# Patient Record
Sex: Female | Born: 1975
Health system: Southern US, Community
[De-identification: ages and names within clinical notes are randomized; demographics above are authoritative.]

## PROBLEM LIST (undated history)

## (undated) DIAGNOSIS — N7011 Chronic salpingitis: Secondary | ICD-10-CM

## (undated) DIAGNOSIS — N736 Female pelvic peritoneal adhesions (postinfective): Secondary | ICD-10-CM

## (undated) DIAGNOSIS — K219 Gastro-esophageal reflux disease without esophagitis: Secondary | ICD-10-CM

## (undated) DIAGNOSIS — Z973 Presence of spectacles and contact lenses: Secondary | ICD-10-CM

## (undated) HISTORY — PX: WISDOM TOOTH EXTRACTION: SHX21

## (undated) HISTORY — DX: Gastro-esophageal reflux disease without esophagitis: K21.9

---

## 1999-02-27 ENCOUNTER — Emergency Department (HOSPITAL_COMMUNITY): Admission: EM | Admit: 1999-02-27 | Discharge: 1999-02-28 | Payer: Self-pay | Admitting: Emergency Medicine

## 1999-03-16 ENCOUNTER — Other Ambulatory Visit: Admission: RE | Admit: 1999-03-16 | Discharge: 1999-03-16 | Payer: Self-pay | Admitting: Obstetrics and Gynecology

## 2000-10-24 ENCOUNTER — Other Ambulatory Visit: Admission: RE | Admit: 2000-10-24 | Discharge: 2000-10-24 | Payer: Self-pay | Admitting: *Deleted

## 2000-10-24 ENCOUNTER — Other Ambulatory Visit: Admission: RE | Admit: 2000-10-24 | Discharge: 2000-10-24 | Payer: Self-pay | Admitting: Obstetrics and Gynecology

## 2001-07-15 ENCOUNTER — Other Ambulatory Visit: Admission: RE | Admit: 2001-07-15 | Discharge: 2001-07-15 | Payer: Self-pay | Admitting: Obstetrics and Gynecology

## 2003-02-27 ENCOUNTER — Other Ambulatory Visit: Admission: RE | Admit: 2003-02-27 | Discharge: 2003-02-27 | Payer: Self-pay | Admitting: Obstetrics and Gynecology

## 2003-02-27 ENCOUNTER — Other Ambulatory Visit: Admission: RE | Admit: 2003-02-27 | Discharge: 2003-02-27 | Payer: Self-pay | Admitting: *Deleted

## 2004-04-22 ENCOUNTER — Other Ambulatory Visit: Admission: RE | Admit: 2004-04-22 | Discharge: 2004-04-22 | Payer: Self-pay | Admitting: Obstetrics and Gynecology

## 2007-12-11 LAB — CONVERTED CEMR LAB: Pap Smear: NORMAL

## 2009-02-19 ENCOUNTER — Ambulatory Visit: Payer: Self-pay | Admitting: Family Medicine

## 2009-02-20 ENCOUNTER — Encounter: Payer: Self-pay | Admitting: Family Medicine

## 2009-02-25 LAB — CONVERTED CEMR LAB
ALT: 18 units/L (ref 0–35)
AST: 22 units/L (ref 0–37)
Albumin: 4.2 g/dL (ref 3.5–5.2)
Alkaline Phosphatase: 58 units/L (ref 39–117)
BUN: 8 mg/dL (ref 6–23)
Basophils Absolute: 0 10*3/uL (ref 0.0–0.1)
Basophils Relative: 0.1 % (ref 0.0–3.0)
Bilirubin, Direct: 0.1 mg/dL (ref 0.0–0.3)
CO2: 29 meq/L (ref 19–32)
Calcium: 9.1 mg/dL (ref 8.4–10.5)
Chloride: 105 meq/L (ref 96–112)
Cholesterol: 169 mg/dL (ref 0–200)
Creatinine, Ser: 0.8 mg/dL (ref 0.4–1.2)
Eosinophils Absolute: 0.1 10*3/uL (ref 0.0–0.7)
Eosinophils Relative: 1.3 % (ref 0.0–5.0)
GFR calc non Af Amer: 88.84 mL/min (ref >60)
Glucose, Bld: 92 mg/dL (ref 70–99)
HCT: 40.7 % (ref 36.0–46.0)
HDL: 79.5 mg/dL (ref >39.00)
Hemoglobin: 13.6 g/dL (ref 12.0–15.0)
LDL Cholesterol: 83 mg/dL (ref 0–99)
Lymphocytes Relative: 33.2 % (ref 12.0–46.0)
Lymphs Abs: 1.3 10*3/uL (ref 0.7–4.0)
MCHC: 33.4 g/dL (ref 30.0–36.0)
MCV: 93.4 fL (ref 78.0–100.0)
Monocytes Absolute: 0.3 10*3/uL (ref 0.1–1.0)
Monocytes Relative: 8.7 % (ref 3.0–12.0)
Neutro Abs: 2.2 10*3/uL (ref 1.4–7.7)
Neutrophils Relative %: 56.7 % (ref 43.0–77.0)
Platelets: 188 10*3/uL (ref 150.0–400.0)
Potassium: 4.8 meq/L (ref 3.5–5.1)
RBC: 4.36 M/uL (ref 3.87–5.11)
RDW: 11.9 % (ref 11.5–14.6)
Sodium: 140 meq/L (ref 135–145)
TSH: 1.16 microintl units/mL (ref 0.35–5.50)
Total Bilirubin: 0.6 mg/dL (ref 0.3–1.2)
Total CHOL/HDL Ratio: 2
Total Protein: 6.9 g/dL (ref 6.0–8.3)
Triglycerides: 34 mg/dL (ref 0.0–149.0)
VLDL: 6.8 mg/dL (ref 0.0–40.0)
WBC: 3.9 10*3/uL — ABNORMAL LOW (ref 4.5–10.5)

## 2009-06-10 LAB — CONVERTED CEMR LAB: Pap Smear: NORMAL

## 2009-06-10 LAB — HM PAP SMEAR

## 2009-08-26 ENCOUNTER — Ambulatory Visit: Payer: Self-pay | Admitting: Family Medicine

## 2009-08-26 DIAGNOSIS — D179 Benign lipomatous neoplasm, unspecified: Secondary | ICD-10-CM | POA: Insufficient documentation

## 2009-08-26 DIAGNOSIS — R87619 Unspecified abnormal cytological findings in specimens from cervix uteri: Secondary | ICD-10-CM | POA: Insufficient documentation

## 2010-02-22 NOTE — Assessment & Plan Note (Signed)
Summary: NEW PT TO EST/CPX/CLE   Vital Signs:  Patient profile:   35 year old female Height:      65 inches Weight:      151.4 pounds BMI:     25.29 Temp:     99.0 degrees F oral Pulse rate:   84 / minute Pulse rhythm:   regular BP sitting:   110 / 80  (left arm) Cuff size:   regular  Vitals Entered By: Benny Lennert CMA Duncan Dull) (August 26, 2009 10:19 AM)  History of Present Illness: Chief complaint New Patient  Working on [employer].  She is working on getting all points to [employer].    Allergies (verified): No Known Drug Allergies  Past History:  Past Medical History: Allergies, well controlled Urinary Tract Infections GERD, resolved  Past Surgical History: Denies surgical history Colposcopy: negative 2004  Family History: Uncle--CABG at 34yo  Father: prostate cancer, HTN, DM, MI age 35, transient global amnesia mother: healthy siblings: smokers, overweight  Family History of Arthritis Family History Diabetes 1st degree relative Family History Hypertension Family History of CAD Female 1st degree relative 33yo--F Family History of Melanoma--  father  Social History: Married--  no children Former Smoker...4 pack year history Alcohol use-yes, wine daily, 1 glass Drug use-no Regular exercise-yes, walking daily Occupation:  Engineer, site for a group  Physical Exam  General:  Well-developed,well-nourished,in no acute distress; alert,appropriate and cooperative throughout examination Eyes:  No corneal or conjunctival inflammation noted. EOMI. Perrla. Funduscopic exam benign, without hemorrhages, exudates or papilledema. Vision grossly normal. Ears:  External ear exam shows no significant lesions or deformities.  Otoscopic examination reveals clear canals, tympanic membranes are intact bilaterally without bulging, retraction, inflammation or discharge. Hearing is grossly normal bilaterally. Nose:  External nasal examination  shows no deformity or inflammation. Nasal mucosa are pink and moist without lesions or exudates. Mouth:  Oral mucosa and oropharynx without lesions or exudates.  Teeth in good repair. Neck:  No deformities, masses, or tenderness noted. Lungs:  Normal respiratory effort, chest expands symmetrically. Lungs are clear to auscultation, no crackles or wheezes. Heart:  Normal rate and regular rhythm. S1 and S2 normal without gallop, murmur, click, rub or other extra sounds. Abdomen:  Bowel sounds positive,abdomen soft and non-tender without masses, organomegaly or hernias noted. Msk:  No deformity or scoliosis noted of thoracic or lumbar spine.   Pulses:  R and L posterior tibial pulses are full and equal bilaterally  Extremities:  0.3 small mobile nodule on right leg Neurologic:  No cranial nerve deficits noted. Station and gait are normal. Plantar reflexes are down-going bilaterally. DTRs are symmetrical throughout. Sensory, motor and coordinative functions appear intact.   Impression & Recommendations:  Problem # 1:  LIPOMA (ICD-214.9) Pt reassured likely begnign lesion. Call if tender  or increasing in size.   Problem # 2:  Sports physical (other med exam for admin purpose) (ICD-V70.3) Assessment: Comment Only Forms completed for [employer] for insurance.   Prior Medications (reviewed today): None Current Allergies (reviewed today): No known allergies   Last PAP:  normal (12/11/2007 8:55:14 AM) PAP Result Date:  06/10/2009 PAP Result:  normal PAP Next Due:  1 yr

## 2010-02-22 NOTE — Assessment & Plan Note (Signed)
Summary: CPX/NEW/PH   Vital Signs:  Patient profile:   35 year old female Height:      65 inches Weight:      151.13 pounds BMI:     25.24 Temp:     98.2 degrees F oral Pulse rate:   83 / minute Pulse rhythm:   regular BP sitting:   110 / 70  (left arm) Cuff size:   regular  Vitals Entered By: Army Fossa CMA (February 19, 2009 8:24 AM) CC: To establish, CPX- no complaints- pt is fasting. no pap.   History of Present Illness: Pt here for cpe and labs.   Pt sees Dr Huel Cote for gyn.   No complaints.   Preventive Screening-Counseling & Management  Alcohol-Tobacco     Alcohol drinks/day: 2     Alcohol type: wine     Smoking Status: quit     Smoke Cessation Stage: quit     Packs/Day: 0.25     Year Started: 2002     Year Quit: 2006  Caffeine-Diet-Exercise     Caffeine use/day: 2     Caffeine Counseling: not indicated; caffeine use is not excessive or problematic     Does Patient Exercise: yes     Type of exercise: long distance run, 5 K, bike     Exercise (avg: min/session): 30-60     Times/week: 3  Hep-HIV-STD-Contraception     Dental Visit-last 6 months no     Dental Care Counseling: 1x a year     SBE monthly: no      Sexual History:  currently monogamous.        Drug Use:  no.    Current Medications (verified): 1)  None  Allergies (verified): No Known Drug Allergies  Past History:  Family History: Last updated: 02/19/2009 PUncle--CABG at 33yo  Family History of Arthritis Family History Diabetes 1st degree relative Family History Hypertension Family History of CAD Female 1st degree relative 33yo--F Family History of Melanoma--  father  Social History: Last updated: 02/19/2009 Married--  no children Former Smoker Alcohol use-yes Drug use-no Regular exercise-yes Occupation:  Engineer, site for a group  Risk Factors: Alcohol Use: 2 (02/19/2009) Caffeine Use: 2 (02/19/2009) Exercise: yes (02/19/2009)  Risk Factors: Smoking  Status: quit (02/19/2009) Packs/Day: 0.25 (02/19/2009)  Past Medical History: Allergies Urinary Tract Infections GERD  Past Surgical History: Denies surgical history  Family History: Reviewed history and no changes required. PUncle--CABG at 33yo  Family History of Arthritis Family History Diabetes 1st degree relative Family History Hypertension Family History of CAD Female 1st degree relative 33yo--F Family History of Melanoma--  father  Social History: Married--  no children Former Smoker Alcohol use-yes Drug use-no Regular exercise-yes Occupation:  Engineer, site for a group Smoking Status:  quit Drug Use:  no Does Patient Exercise:  yes Packs/Day:  0.25 Caffeine use/day:  2 Occupation:  employed Deal[employer] Care w/in 6 mos.:  no Sexual History:  currently monogamous  Review of Systems      See HPI General:  Denies chills, fatigue, fever, loss of appetite, malaise, sleep disorder, sweats, weakness, and weight loss. Eyes:  Denies blurring, discharge, double vision, eye irritation, eye pain, halos, itching, light sensitivity, red eye, vision loss-1 eye, and vision loss-both eyes; optho q1y . ENT:  Denies decreased hearing, difficulty swallowing, ear discharge, earache, hoarseness, nasal congestion, nosebleeds, postnasal drainage, ringing in ears, sinus pressure, and sore throat. CV:  Denies bluish discoloration of lips or nails, chest pain or discomfort, difficulty  breathing at night, difficulty breathing while lying down, fainting, fatigue, leg cramps with exertion, lightheadness, near fainting, palpitations, shortness of breath with exertion, swelling of feet, swelling of hands, and weight gain. Resp:  Denies chest discomfort, chest pain with inspiration, cough, coughing up blood, excessive snoring, hypersomnolence, morning headaches, pleuritic, shortness of breath, sputum productive, and wheezing. GI:  Denies abdominal pain, bloody stools, change in bowel habits,  constipation, dark tarry stools, diarrhea, excessive appetite, gas, hemorrhoids, indigestion, loss of appetite, nausea, vomiting, vomiting blood, and yellowish skin color. GU:  Denies abnormal vaginal bleeding, decreased libido, discharge, dysuria, hematuria, incontinence, nocturia, urinary frequency, and urinary hesitancy. MS:  Denies joint pain, joint redness, joint swelling, loss of strength, low back pain, mid back pain, muscle aches, muscle , cramps, muscle weakness, stiffness, and thoracic pain. Derm:  Denies changes in color of skin, changes in nail beds, dryness, excessive perspiration, flushing, hair loss, insect bite(s), itching, lesion(s), poor wound healing, and rash. Neuro:  Denies brief paralysis, difficulty with concentration, disturbances in coordination, falling down, headaches, inability to speak, memory loss, numbness, poor balance, seizures, sensation of room spinning, tingling, tremors, visual disturbances, and weakness. Psych:  Denies alternate hallucination ( auditory/visual), anxiety, depression, easily angered, easily tearful, irritability, mental problems, panic attacks, sense of great danger, suicidal thoughts/plans, thoughts of violence, unusual visions or sounds, and thoughts /plans of harming others. Endo:  Denies cold intolerance, excessive hunger, excessive thirst, excessive urination, heat intolerance, polyuria, and weight change. Heme:  Denies abnormal bruising, bleeding, enlarge lymph nodes, fevers, pallor, and skin discoloration.  Physical Exam  General:  Well-developed,well-nourished,in no acute distress; alert,appropriate and cooperative throughout examination Head:  Normocephalic and atraumatic without obvious abnormalities. No apparent alopecia or balding. Eyes:  vision grossly intact, pupils equal, pupils round, pupils reactive to light, and no injection.   Ears:  External ear exam shows no significant lesions or deformities.  Otoscopic examination reveals clear  canals, tympanic membranes are intact bilaterally without bulging, retraction, inflammation or discharge. Hearing is grossly normal bilaterally. Nose:  External nasal examination shows no deformity or inflammation. Nasal mucosa are pink and moist without lesions or exudates. Mouth:  Oral mucosa and oropharynx without lesions or exudates.  Teeth in good repair. Neck:  No deformities, masses, or tenderness noted. Chest Wall:  No deformities, masses, or tenderness noted. Breasts:  No mass, nodules, thickening, tenderness, bulging, retraction, inflamation, nipple discharge or skin changes noted.   Lungs:  Normal respiratory effort, chest expands symmetrically. Lungs are clear to auscultation, no crackles or wheezes. Heart:  normal rate and no murmur.   Abdomen:  Bowel sounds positive,abdomen soft and non-tender without masses, organomegaly or hernias noted. Rectal:  gyn Genitalia:  gyn Msk:  normal ROM, no joint tenderness, no joint swelling, no joint warmth, no redness over joints, no joint deformities, no joint instability, and no crepitation.   Pulses:  R and L carotid,radial,femoral,dorsalis pedis and posterior tibial pulses are full and equal bilaterally Extremities:  No clubbing, cyanosis, edema, or deformity noted with normal full range of motion of all joints.   Neurologic:  No cranial nerve deficits noted. Station and gait are normal. Plantar reflexes are down-going bilaterally. DTRs are symmetrical throughout. Sensory, motor and coordinative functions appear intact. Skin:  Intact without suspicious lesions or rashes Cervical Nodes:  No lymphadenopathy noted Axillary Nodes:  No palpable lymphadenopathy Psych:  Oriented X3, normally interactive, good eye contact, not anxious appearing, not depressed appearing, and not agitated.     Impression & Recommendations:  Problem # 1:  PREVENTIVE HEALTH CARE (ICD-V70.0) ghm utd  SBE monthly Orders: Venipuncture (16109) TLB-Lipid Panel  (80061-LIPID) TLB-BMP (Basic Metabolic Panel-BMET) (80048-METABOL) TLB-CBC Platelet - w/Differential (85025-CBCD) TLB-Hepatic/Liver Function Pnl (80076-HEPATIC) TLB-TSH (Thyroid Stimulating Hormone) (84443-TSH)  Problem # 2:  FAMILY HISTORY OF MELANOMA (ICD-V16.8) skin check done today pt has a dermatologist--Dr Gruber---I advised she see her 1x a year  Flu Vaccine Next Due:  Refused TD Result Date:  09/04/2007 TD Result:  given TD Next Due:  10 yr PAP Result Date:  12/11/2007 PAP Result:  normal PAP Next Due:  1 yr  Appended Document: CPX/NEW/PH  Laboratory Results   Urine Tests   Date/Time Reported: February 19, 2009 10:34 AM   Routine Urinalysis   Color: lt. yellow Appearance: Clear Glucose: negative   (Normal Range: Negative) Bilirubin: negative   (Normal Range: Negative) Ketone: negative   (Normal Range: Negative) Spec. Gravity: <1.005   (Normal Range: 1.003-1.035) Blood: moderate   (Normal Range: Negative) pH: 7.5   (Normal Range: 5.0-8.0) Protein: negative   (Normal Range: Negative) Urobilinogen: negative   (Normal Range: 0-1) Nitrite: negative   (Normal Range: Negative) Leukocyte Esterace: negative   (Normal Range: Negative)    Comments: Floydene Flock  February 19, 2009 10:35 AM cx sent

## 2010-07-06 ENCOUNTER — Encounter: Payer: Self-pay | Admitting: Family Medicine

## 2010-07-07 ENCOUNTER — Telehealth: Payer: Self-pay | Admitting: *Deleted

## 2010-07-07 ENCOUNTER — Ambulatory Visit (INDEPENDENT_AMBULATORY_CARE_PROVIDER_SITE_OTHER): Payer: 59 | Admitting: Family Medicine

## 2010-07-07 ENCOUNTER — Encounter: Payer: Self-pay | Admitting: Family Medicine

## 2010-07-07 VITALS — BP 108/78 | HR 66 | Temp 98.3°F | Ht 66.0 in | Wt 147.8 lb

## 2010-07-07 DIAGNOSIS — H6980 Other specified disorders of Eustachian tube, unspecified ear: Secondary | ICD-10-CM

## 2010-07-07 DIAGNOSIS — H6990 Unspecified Eustachian tube disorder, unspecified ear: Secondary | ICD-10-CM

## 2010-07-07 MED ORDER — FLUTICASONE PROPIONATE 50 MCG/ACT NA SUSP: 2.0000 | Freq: Every day | NASAL | Status: DC

## 2010-07-07 NOTE — Telephone Encounter (Signed)
Entered in error. Wrong patient  

## 2010-07-07 NOTE — Telephone Encounter (Signed)
Patient says that she has been swimming a lot and now has swimmers ear. She says that she has trouble with this every summer. She is asking if she can get some ear drops called in for it. Please advise.

## 2010-07-07 NOTE — Patient Instructions (Addendum)
Phyenylephrine -- as decongestant. (ALKA SELZER PLUS SINUS HAS THIS)  Add in a nasal spray, 2 sprays each nostril once a day

## 2010-07-07 NOTE — Progress Notes (Signed)
Teresa Brock, a 35 y.o. female presents today in the office for the following:   Last sat did a 5k [place].   Did not take her allergy medication then, and on Monday, eyes were really bloodshot, and feeling some congestion and  Taking some Allegra twelve hours.  Used some qttips.  Fullness in ears, allergies  ROS: GEN: No acute illnesses, no fevers, chills. GI: No n/v/d, eating normally Pulm: No SOB Interactive and getting along well at home.  Otherwise, ROS is as per the HPI.   Physical Exam  Blood pressure 108/78, pulse 66, temperature 98.3 F (36.8 C), temperature source Oral, height 5\' 6"  (1.676 m), weight 147 lb 12.8 oz (67.042 kg), SpO2 98.00%.  GEN: WDWN, NAD, Non-toxic, A & O x 3 HEENT: Atraumatic, Normocephalic. Neck supple. No masses, No LAD. Ears and Nose: No external deformity. Tragus NT, serous fluid B, no sinus T EXTR: No c/c/e NEURO Normal gait.  PSYCH: Normally interactive. Conversant. Not depressed or anxious appearing.  Calm demeanor.

## 2011-02-08 ENCOUNTER — Encounter: Payer: Self-pay | Admitting: Family Medicine

## 2011-02-08 ENCOUNTER — Ambulatory Visit (INDEPENDENT_AMBULATORY_CARE_PROVIDER_SITE_OTHER): Payer: 59 | Admitting: Family Medicine

## 2011-02-08 VITALS — BP 100/60 | HR 60 | Temp 98.9°F | Ht 66.0 in | Wt 153.4 lb

## 2011-02-08 DIAGNOSIS — G2589 Other specified extrapyramidal and movement disorders: Secondary | ICD-10-CM

## 2011-02-08 DIAGNOSIS — R279 Unspecified lack of coordination: Secondary | ICD-10-CM

## 2011-02-08 DIAGNOSIS — M62838 Other muscle spasm: Secondary | ICD-10-CM

## 2011-02-08 NOTE — Progress Notes (Signed)
  Patient Name: Teresa Brock Date of Birth: 03/04/1975 Age: 36 y.o. Medical Record Number: [id number] Gender: female Date of Encounter: 02/08/2011  History of Present Illness:  Teresa Brock is a 36 y.o. very pleasant female patient who presents with the following:  Last few months some back pain --- doing some 5k's. This winter has stopped some exercise. Mostly on the right side and on the right side. Will wake up with turning and moving in the moddile of the night Gen. Of pain around the lower scapular border. No radiculopathy. No numbness or tingling. No neck pathology noted. Mostly parascapular stabilization issue. She is having some pain when she is at work and working on the computer.  Past Medical History, Surgical History, Social History, Family History, Problem List, Medications, and Allergies have been reviewed and updated if relevant.  Review of Systems:  GEN: No fevers, chills. Nontoxic. Primarily MSK c/o today. MSK: Detailed in the HPI GI: tolerating PO intake without difficulty Neuro: No numbness, parasthesias, or tingling associated. Otherwise the pertinent positives of the ROS are noted above.    Physical Examination: Filed Vitals:   02/08/11 1132  BP: 100/60  Pulse: 60  Temp: 98.9 F (37.2 C)  TempSrc: Oral  Height: 5\' 6"  (1.676 m)  Weight: 153 lb 6.4 oz (69.582 kg)  SpO2: 100%    Body mass index is 24.76 kg/(m^2).   GEN: WDWN, NAD, Non-toxic, Alert & Oriented x 3 HEENT: Atraumatic, Normocephalic.  Ears and Nose: No external deformity. EXTR: No clubbing/cyanosis/edema NEURO: Normal gait.  PSYCH: Normally interactive. Conversant. Not depressed or anxious appearing.  Calm demeanor.   Shoulder: b Inspection: mild-mod winging, with notable scapular dyskinesis, RIGHT greater than LEFT. Noted posteriorly in abduction with motion. There is also a generalized weakness in this area. Ecchymosis/edema: neg  AC joint, scapula, clavicle:  NT Cervical spine: NT, full ROM Spurling's: neg Abduction: full, 5/5 Flexion: full, 5/5 IR, full, lift-off: 5/5 ER at neutral: full, 5/5 AC crossover and compression: neg Neer: neg Hawkins: neg Drop Test: neg Empty Can: neg Supraspinatus insertion: NT Bicipital groove: NT Speed's: neg Yergason's: neg Sulcus sign: neg C5-T1 intact Sensation intact Grip 5/5   Assessment and Plan: 1. Scapular dyskinesis   2. Trapezius muscle spasm     Primarily due to the rhomboid and trapezius spasm, deconditioning of the scapular stabilizers. Reviewed scapular stabilization

## 2011-02-08 NOTE — Patient Instructions (Signed)
[email or web address] ALL SHOULDER VIDEOS AND WORKOUT DO 3-4 TIMES A WEEK

## 2011-05-17 ENCOUNTER — Ambulatory Visit (INDEPENDENT_AMBULATORY_CARE_PROVIDER_SITE_OTHER): Payer: 59 | Admitting: Family Medicine

## 2011-05-17 ENCOUNTER — Encounter: Payer: Self-pay | Admitting: Family Medicine

## 2011-05-17 VITALS — BP 120/78 | HR 64 | Temp 98.7°F | Ht 66.0 in | Wt 153.0 lb

## 2011-05-17 DIAGNOSIS — M940 Chondrocostal junction syndrome [Tietze]: Secondary | ICD-10-CM

## 2011-05-17 NOTE — Progress Notes (Signed)
  Patient Name: Teresa Brock Date of Birth: [birth year] Age: 36 y.o. Medical Record Number: [id number] Gender: female Date of Encounter: 05/17/2011  History of Present Illness:  Teresa Brock is a 36 y.o. very pleasant female patient who presents with the following:  Jogs a fair amount.   About a year ago, noticed when eating dinner, and felt something in her side. And massaged, no pain, then about a month later it happened again. Now when noticing, and will feel some pain in her posture. Felt that acutely with laughing really hard. She had been concerned, because it kept on lasting for some time. She wanted to make sure that I didn't think it was a type of cancer or potential heart problem or anything else serious.   Dad had melanoma and prostate cancer.   Past Medical History, Surgical History, Social History, Family History, Problem List, Medications, and Allergies have been reviewed and updated if relevant.  Review of Systems:  GEN: No acute illnesses, no fevers, chills. GI: No n/v/d, eating normally Pulm: No SOB Interactive and getting along well at home.  Otherwise, ROS is as per the HPI.   Physical Examination: Filed Vitals:   05/17/11 0907  BP: 120/78  Pulse: 64  Temp: 98.7 F (37.1 C)  TempSrc: Oral  Height: 5\' 6"  (1.676 m)  Weight: 153 lb (69.4 kg)  SpO2: 98%    Body mass index is 24.69 kg/(m^2).   GEN: WDWN, NAD, Non-toxic, Alert & Oriented x 3 HEENT: Atraumatic, Normocephalic.  Ears and Nose: No external deformity. Chest wall: no tenderness along rib insertions at the sternum. Perl test is negative. Along the RIGHT 10th rib space, there is tenderness to palpation between the ribs. This is focal, and the patient describes as the patient's pain location EXTR: No clubbing/cyanosis/edema NEURO: Normal gait.  PSYCH: Normally interactive. Conversant. Not depressed or anxious appearing.  Calm demeanor.    Assessment and Plan: 1. Costochondritis      I reassured the patient. This is muscular skeletal chest pain. If it flares up and bothers her, recommended that she take some Motrin or Aleve.

## 2013-05-02 ENCOUNTER — Telehealth: Payer: Self-pay | Admitting: Family Medicine

## 2013-05-02 NOTE — Telephone Encounter (Signed)
Yes, work her in at any 32min OV.

## 2013-05-02 NOTE — Telephone Encounter (Signed)
Pt called requesting cpe in April. Would you be able to accommodate?

## 2013-05-06 ENCOUNTER — Encounter: Payer: Self-pay | Admitting: Family Medicine

## 2013-05-06 ENCOUNTER — Ambulatory Visit (INDEPENDENT_AMBULATORY_CARE_PROVIDER_SITE_OTHER): Payer: 59 | Admitting: Family Medicine

## 2013-05-06 VITALS — BP 120/76 | HR 63 | Temp 98.3°F | Ht 67.0 in | Wt 157.8 lb

## 2013-05-06 DIAGNOSIS — Z0000 Encounter for general adult medical examination without abnormal findings: Secondary | ICD-10-CM

## 2013-05-06 DIAGNOSIS — Z13220 Encounter for screening for lipoid disorders: Secondary | ICD-10-CM

## 2013-05-06 LAB — COMPREHENSIVE METABOLIC PANEL
ALT: 13 U/L (ref 0–35)
AST: 16 U/L (ref 0–37)
Albumin: 3.9 g/dL (ref 3.5–5.2)
Alkaline Phosphatase: 45 U/L (ref 39–117)
BUN: 9 mg/dL (ref 6–23)
CO2: 25 mEq/L (ref 19–32)
Calcium: 8.9 mg/dL (ref 8.4–10.5)
Chloride: 109 mEq/L (ref 96–112)
Creatinine, Ser: 0.8 mg/dL (ref 0.4–1.2)
GFR: 87.85 mL/min (ref >60.00)
Glucose, Bld: 92 mg/dL (ref 70–99)
Potassium: 4.2 mEq/L (ref 3.5–5.1)
Sodium: 139 mEq/L (ref 135–145)
Total Bilirubin: 0.1 mg/dL — ABNORMAL LOW (ref 0.3–1.2)
Total Protein: 6.7 g/dL (ref 6.0–8.3)

## 2013-05-06 LAB — LIPID PANEL
Cholesterol: 171 mg/dL (ref 0–200)
HDL: 74 mg/dL (ref >39.00)
LDL Cholesterol: 89 mg/dL (ref 0–99)
Total CHOL/HDL Ratio: 2
Triglycerides: 40 mg/dL (ref 0.0–149.0)
VLDL: 8 mg/dL (ref 0.0–40.0)

## 2013-05-06 NOTE — Progress Notes (Signed)
Pre visit review using our clinic review tool, if applicable. No additional management support is needed unless otherwise documented below in the visit note. 

## 2013-05-06 NOTE — Progress Notes (Signed)
   Subjective:    Patient ID: Teresa Brock, female    DOB: Feb 15, 1975, 38 y.o.   MRN: [id number]  HPI The patient is here for annual wellness exam and preventative care.    Was normal at work last year. She is due for annual labs.  She has GYN.Marland Kitchen She has scheduled pap and breast exam in 05/2012.   No specific issues. She is working on having a baby.  exercise 3-4 times a week, walking/running. She runs for fun. Diet: healthy, water, good calcium intake.  akes daily multivitamin.   Review of Systems  Constitutional: Negative for chills and fatigue.  HENT: Negative for ear pain.   Eyes: Negative for pain.  Respiratory: Negative for cough, shortness of breath and wheezing.   Cardiovascular: Negative for chest pain, palpitations and leg swelling.  Gastrointestinal: Negative for abdominal pain, diarrhea, constipation and blood in stool.  Genitourinary: Negative for dysuria, urgency and frequency.  Musculoskeletal: Negative for myalgias.  Neurological: Negative for dizziness and numbness.  Psychiatric/Behavioral: Negative for dysphoric mood. The patient is not nervous/anxious.        Objective:   Physical Exam  Constitutional: Vital signs are normal. She appears well-developed and well-nourished. She is cooperative.  Non-toxic appearance. She does not appear ill. No distress.  HENT:  Head: Normocephalic.  Right Ear: Hearing, tympanic membrane, external ear and ear canal normal.  Left Ear: Hearing, tympanic membrane, external ear and ear canal normal.  Nose: Nose normal.  Eyes: Conjunctivae, EOM and lids are normal. Pupils are equal, round, and reactive to light. Lids are everted and swept, no foreign bodies found.  Neck: Trachea normal and normal range of motion. Neck supple. Carotid bruit is not present. No mass and no thyromegaly present.  Cardiovascular: Normal rate, regular rhythm, S1 normal, S2 normal, normal heart sounds and intact distal pulses.  Exam reveals no  gallop.   No murmur heard. Pulmonary/Chest: Effort normal and breath sounds normal. No respiratory distress. She has no wheezes. She has no rhonchi. She has no rales.  Abdominal: Soft. Normal appearance and bowel sounds are normal. She exhibits no distension, no fluid wave, no abdominal bruit and no mass. There is no hepatosplenomegaly. There is no tenderness. There is no rebound, no guarding and no CVA tenderness. No hernia.  Lymphadenopathy:    She has no cervical adenopathy.    She has no axillary adenopathy.  Neurological: She is alert. She has normal strength. No cranial nerve deficit or sensory deficit.  Skin: Skin is warm, dry and intact. No rash noted.  Psychiatric: Her speech is normal and behavior is normal. Judgment normal. Her mood appears not anxious. Cognition and memory are normal. She does not exhibit a depressed mood.          Assessment & Plan:  The patient's preventative maintenance and recommended screening tests for an annual wellness exam were reviewed in full today. Brought up to date unless services declined.  Counselled on the importance of diet, exercise, and its role in overall health and mortality. The patient's FH and SH was reviewed, including their home life, tobacco status, and drug and alcohol status.   Vacccines up to date:  Will look into whether she has Tdap or td.

## 2013-05-06 NOTE — Patient Instructions (Signed)
Keep up the great work with healthy eating and exercise!

## 2013-12-01 ENCOUNTER — Encounter (HOSPITAL_BASED_OUTPATIENT_CLINIC_OR_DEPARTMENT_OTHER): Payer: Self-pay | Admitting: *Deleted

## 2013-12-01 NOTE — Progress Notes (Signed)
NPO AFTER MN WITH EXCEPTION CLEAR LIQUIDS UNTIL 0700 (NO CREAM/ MILK PRODUCTS).  ARRIVE AT 1145. NEEDS HG AND URINE PREG. 

## 2013-12-03 ENCOUNTER — Encounter (HOSPITAL_BASED_OUTPATIENT_CLINIC_OR_DEPARTMENT_OTHER)
Admission: RE | Disposition: A | Discharge status: Home / Self Care | Payer: Self-pay | Source: Ambulatory Visit | Attending: Obstetrics and Gynecology

## 2013-12-03 ENCOUNTER — Ambulatory Visit (HOSPITAL_BASED_OUTPATIENT_CLINIC_OR_DEPARTMENT_OTHER)
Admission: RE | Admit: 2013-12-03 | Discharge: 2013-12-03 | Disposition: A | Discharge status: Home / Self Care | Payer: 59 | Source: Ambulatory Visit | Attending: Obstetrics and Gynecology | Admitting: Obstetrics and Gynecology

## 2013-12-03 ENCOUNTER — Encounter (HOSPITAL_BASED_OUTPATIENT_CLINIC_OR_DEPARTMENT_OTHER): Payer: Self-pay

## 2013-12-03 ENCOUNTER — Ambulatory Visit (HOSPITAL_BASED_OUTPATIENT_CLINIC_OR_DEPARTMENT_OTHER): Payer: 59 | Admitting: Certified Registered"

## 2013-12-03 DIAGNOSIS — N9489 Other specified conditions associated with female genital organs and menstrual cycle: Secondary | ICD-10-CM | POA: Insufficient documentation

## 2013-12-03 DIAGNOSIS — N7011 Chronic salpingitis: Secondary | ICD-10-CM | POA: Diagnosis present

## 2013-12-03 DIAGNOSIS — Z87891 Personal history of nicotine dependence: Secondary | ICD-10-CM | POA: Insufficient documentation

## 2013-12-03 DIAGNOSIS — N736 Female pelvic peritoneal adhesions (postinfective): Secondary | ICD-10-CM | POA: Diagnosis not present

## 2013-12-03 DIAGNOSIS — K219 Gastro-esophageal reflux disease without esophagitis: Secondary | ICD-10-CM | POA: Insufficient documentation

## 2013-12-03 HISTORY — DX: Female pelvic peritoneal adhesions (postinfective): N73.6

## 2013-12-03 HISTORY — DX: Chronic salpingitis: N70.11

## 2013-12-03 HISTORY — PX: LAPAROSCOPY: SHX197

## 2013-12-03 HISTORY — DX: Presence of spectacles and contact lenses: Z97.3

## 2013-12-03 LAB — POCT HEMOGLOBIN-HEMACUE: HEMOGLOBIN: 13.2 g/dL (ref 12.0–15.0)

## 2013-12-03 LAB — POCT PREGNANCY, URINE: Preg Test, Ur: NEGATIVE

## 2013-12-03 SURGERY — LAPAROSCOPY OPERATIVE
Anesthesia: General | Site: Uterus | Laterality: Bilateral

## 2013-12-03 MED ORDER — BUPIVACAINE-EPINEPHRINE 0.25% -1:200000 IJ SOLN
INTRAMUSCULAR | Status: DC | PRN
  Administered 2013-12-03: 8 mL

## 2013-12-03 MED ORDER — ONDANSETRON HCL 4 MG PO TABS: 4.0000 mg | ORAL_TABLET | Freq: Three times a day (TID) | ORAL | Status: DC | PRN

## 2013-12-03 MED ORDER — MIDAZOLAM HCL 5 MG/5ML IJ SOLN
INTRAMUSCULAR | Status: DC | PRN
  Administered 2013-12-03: 2 mg via INTRAVENOUS

## 2013-12-03 MED ORDER — DEXAMETHASONE SODIUM PHOSPHATE 4 MG/ML IJ SOLN
INTRAMUSCULAR | Status: DC | PRN
  Administered 2013-12-03: 8 mg via INTRAVENOUS

## 2013-12-03 MED ORDER — SUCCINYLCHOLINE CHLORIDE 20 MG/ML IJ SOLN
INTRAMUSCULAR | Status: DC | PRN
  Administered 2013-12-03: 100 mg via INTRAVENOUS

## 2013-12-03 MED ORDER — FENTANYL CITRATE 0.05 MG/ML IJ SOLN
INTRAMUSCULAR | Status: AC
  Filled 2013-12-03: qty 2

## 2013-12-03 MED ORDER — OXYCODONE HCL 5 MG PO TABS
7.5000 mg | ORAL_TABLET | Freq: Once | ORAL | Status: AC
Start: 2013-12-03 — End: 2013-12-03
  Administered 2013-12-03: 7.5 mg via ORAL
  Filled 2013-12-03: qty 2

## 2013-12-03 MED ORDER — GLYCOPYRROLATE 0.2 MG/ML IJ SOLN
INTRAMUSCULAR | Status: DC | PRN
  Administered 2013-12-03: 0.4 mg via INTRAVENOUS

## 2013-12-03 MED ORDER — LACTATED RINGERS IV SOLN
INTRAVENOUS | Status: DC
  Administered 2013-12-03 (×3): via INTRAVENOUS
  Filled 2013-12-03: qty 1000

## 2013-12-03 MED ORDER — PROMETHAZINE HCL 25 MG/ML IJ SOLN
6.2500 mg | INTRAMUSCULAR | Status: DC | PRN
  Filled 2013-12-03: qty 1

## 2013-12-03 MED ORDER — LACTATED RINGERS IR SOLN
Status: DC | PRN
  Administered 2013-12-03: 3000 mL

## 2013-12-03 MED ORDER — KETOROLAC TROMETHAMINE 30 MG/ML IJ SOLN
15.0000 mg | Freq: Once | INTRAMUSCULAR | Status: DC | PRN
  Filled 2013-12-03: qty 1

## 2013-12-03 MED ORDER — MIDAZOLAM HCL 2 MG/2ML IJ SOLN
INTRAMUSCULAR | Status: AC
  Filled 2013-12-03: qty 2

## 2013-12-03 MED ORDER — ACETAMINOPHEN 10 MG/ML IV SOLN
INTRAVENOUS | Status: DC | PRN
Start: 2013-12-03 — End: 2013-12-03
  Administered 2013-12-03: 1000 mg via INTRAVENOUS

## 2013-12-03 MED ORDER — ONDANSETRON HCL 4 MG/2ML IJ SOLN
INTRAMUSCULAR | Status: DC | PRN
  Administered 2013-12-03: 4 mg via INTRAVENOUS

## 2013-12-03 MED ORDER — METHYLENE BLUE 1 % INJ SOLN
INTRAMUSCULAR | Status: DC | PRN
  Administered 2013-12-03: 10 mL via SUBMUCOSAL

## 2013-12-03 MED ORDER — CEFAZOLIN SODIUM-DEXTROSE 2-3 GM-% IV SOLR
2.0000 g | INTRAVENOUS | Status: AC
  Administered 2013-12-03: 2 g via INTRAVENOUS
  Filled 2013-12-03: qty 50

## 2013-12-03 MED ORDER — FENTANYL CITRATE 0.05 MG/ML IJ SOLN
INTRAMUSCULAR | Status: AC
  Filled 2013-12-03: qty 6

## 2013-12-03 MED ORDER — NEOSTIGMINE METHYLSULFATE 10 MG/10ML IV SOLN
INTRAVENOUS | Status: DC | PRN
  Administered 2013-12-03: 3 mg via INTRAVENOUS

## 2013-12-03 MED ORDER — FENTANYL CITRATE 0.05 MG/ML IJ SOLN
INTRAMUSCULAR | Status: DC | PRN
  Administered 2013-12-03: 25 ug via INTRAVENOUS
  Administered 2013-12-03 (×2): 50 ug via INTRAVENOUS
  Administered 2013-12-03: 25 ug via INTRAVENOUS
  Administered 2013-12-03: 50 ug via INTRAVENOUS
  Administered 2013-12-03: 25 ug via INTRAVENOUS
  Administered 2013-12-03: 50 ug via INTRAVENOUS
  Administered 2013-12-03: 25 ug via INTRAVENOUS

## 2013-12-03 MED ORDER — ROCURONIUM BROMIDE 100 MG/10ML IV SOLN
INTRAVENOUS | Status: DC | PRN
  Administered 2013-12-03: 10 mg via INTRAVENOUS
  Administered 2013-12-03: 15 mg via INTRAVENOUS
  Administered 2013-12-03: 30 mg via INTRAVENOUS
  Administered 2013-12-03: 10 mg via INTRAVENOUS

## 2013-12-03 MED ORDER — FENTANYL CITRATE 0.05 MG/ML IJ SOLN
25.0000 ug | INTRAMUSCULAR | Status: DC | PRN
  Administered 2013-12-03 (×3): 50 ug via INTRAVENOUS
  Filled 2013-12-03: qty 1

## 2013-12-03 MED ORDER — KETOROLAC TROMETHAMINE 30 MG/ML IJ SOLN
INTRAMUSCULAR | Status: DC | PRN
  Administered 2013-12-03: 30 mg via INTRAVENOUS

## 2013-12-03 MED ORDER — LIDOCAINE HCL (CARDIAC) 20 MG/ML IV SOLN
INTRAVENOUS | Status: DC | PRN
  Administered 2013-12-03: 50 mg via INTRAVENOUS

## 2013-12-03 MED ORDER — OXYCODONE HCL 5 MG PO TABS
ORAL_TABLET | ORAL | Status: AC
  Filled 2013-12-03: qty 2

## 2013-12-03 MED ORDER — PROPOFOL 10 MG/ML IV BOLUS
INTRAVENOUS | Status: DC | PRN
  Administered 2013-12-03: 200 mg via INTRAVENOUS

## 2013-12-03 MED ORDER — SODIUM CHLORIDE 0.9 % IV SOLN
INTRAVENOUS | Status: DC | PRN
  Administered 2013-12-03: 10 mL via INTRAMUSCULAR

## 2013-12-03 MED ORDER — OXYCODONE-ACETAMINOPHEN 7.5-325 MG PO TABS: 1.0000 | ORAL_TABLET | ORAL | Status: DC | PRN

## 2013-12-03 MED ORDER — CEFAZOLIN SODIUM-DEXTROSE 2-3 GM-% IV SOLR
INTRAVENOUS | Status: AC
  Filled 2013-12-03: qty 50

## 2013-12-03 SURGICAL SUPPLY — 76 items
ADH SKN CLS APL DERMABOND .7 (GAUZE/BANDAGES/DRESSINGS)
BAG SPEC RTRVL LRG 6X4 10 (ENDOMECHANICALS)
BAG URINE DRAINAGE (UROLOGICAL SUPPLIES) ×2 IMPLANT
BARRIER ADHS 3X4 INTERCEED (GAUZE/BANDAGES/DRESSINGS) ×1 IMPLANT
BLADE 11 SAFETY STRL DISP (BLADE) ×1 IMPLANT
BLADE SURG 15 STRL LF DISP TIS (BLADE) ×1 IMPLANT
BLADE SURG 15 STRL SS (BLADE)
BRR ADH 4X3 ABS CNTRL BYND (GAUZE/BANDAGES/DRESSINGS) ×1
BRR ADH 6X5 SEPRAFILM 1 SHT (MISCELLANEOUS)
CATH FOLEY 2WAY SLVR  5CC 14FR (CATHETERS) ×1
CATH FOLEY 2WAY SLVR 5CC 14FR (CATHETERS) IMPLANT
CATH ROBINSON RED A/P 16FR (CATHETERS) IMPLANT
COVER MAYO STAND STRL (DRAPES) ×2 IMPLANT
DERMABOND ADVANCED (GAUZE/BANDAGES/DRESSINGS)
DERMABOND ADVANCED .7 DNX12 (GAUZE/BANDAGES/DRESSINGS) IMPLANT
DEVICE TROCAR PUNCTURE CLOSURE (ENDOMECHANICALS) IMPLANT
DRAPE CAMERA CLOSED 9X96 (DRAPES) ×2 IMPLANT
DRAPE UNDERBUTTOCKS STRL (DRAPE) ×2 IMPLANT
DRSG OPSITE POSTOP 3X4 (GAUZE/BANDAGES/DRESSINGS) IMPLANT
ELECT NDL TIP 2.8 STRL (NEEDLE) IMPLANT
ELECT NEEDLE TIP 2.8 STRL (NEEDLE) IMPLANT
ELECT REM PT RETURN 9FT ADLT (ELECTROSURGICAL) ×2
ELECTRODE REM PT RTRN 9FT ADLT (ELECTROSURGICAL) ×1 IMPLANT
EVACUATOR SMOKE 8.L (FILTER) IMPLANT
GLOVE BIO SURGEON STRL SZ 6.5 (GLOVE) ×2 IMPLANT
GLOVE BIO SURGEON STRL SZ8 (GLOVE) ×3 IMPLANT
GLOVE BIOGEL PI IND STRL 6.5 (GLOVE) IMPLANT
GLOVE BIOGEL PI IND STRL 8.5 (GLOVE) ×1 IMPLANT
GLOVE BIOGEL PI INDICATOR 6.5 (GLOVE) ×3
GLOVE BIOGEL PI INDICATOR 8.5 (GLOVE) ×1
GOWN STRL REUS W/ TWL LRG LVL3 (GOWN DISPOSABLE) IMPLANT
GOWN STRL REUS W/ TWL XL LVL3 (GOWN DISPOSABLE) IMPLANT
GOWN STRL REUS W/TWL LRG LVL3 (GOWN DISPOSABLE) ×4
GOWN STRL REUS W/TWL XL LVL3 (GOWN DISPOSABLE) ×2
HOLDER FOLEY CATH W/STRAP (MISCELLANEOUS) ×1 IMPLANT
MANIPULATOR UTERINE 4.5 ZUMI (MISCELLANEOUS) ×2 IMPLANT
NDL HYPO 25X1 1.5 SAFETY (NEEDLE) ×1 IMPLANT
NDL INSUFFLATION 14GA 120MM (NEEDLE) ×1 IMPLANT
NDL SPNL 22GX7 QUINCKE BK (NEEDLE) IMPLANT
NEEDLE HYPO 25X1 1.5 SAFETY (NEEDLE) ×2 IMPLANT
NEEDLE INSUFFLATION 14GA 120MM (NEEDLE) ×2 IMPLANT
NEEDLE SPNL 22GX7 QUINCKE BK (NEEDLE) ×2 IMPLANT
NS IRRIG 500ML POUR BTL (IV SOLUTION) ×2 IMPLANT
PACK BASIN DAY SURGERY FS (CUSTOM PROCEDURE TRAY) ×2 IMPLANT
PACK LAPAROSCOPY II (CUSTOM PROCEDURE TRAY) ×2 IMPLANT
PAD OB MATERNITY 4.3X12.25 (PERSONAL CARE ITEMS) ×2 IMPLANT
PENCIL BUTTON HOLSTER BLD 10FT (ELECTRODE) IMPLANT
POUCH SPECIMEN RETRIEVAL 10MM (ENDOMECHANICALS) IMPLANT
SCALPEL HARMONIC ACE (MISCELLANEOUS) ×1 IMPLANT
SEALER TISSUE G2 CVD JAW 35 (ENDOMECHANICALS) IMPLANT
SEALER TISSUE G2 CVD JAW 45CM (ENDOMECHANICALS) IMPLANT
SEPRAFILM MEMBRANE 5X6 (MISCELLANEOUS) IMPLANT
SET IRRIG TUBING LAPAROSCOPIC (IRRIGATION / IRRIGATOR) ×1 IMPLANT
SOLUTION ANTI FOG 6CC (MISCELLANEOUS) ×2 IMPLANT
SUT MNCRL AB 4-0 PS2 18 (SUTURE) ×2 IMPLANT
SUT PROLENE 0 CT 1 30 (SUTURE) IMPLANT
SUT PROLENE 5 0 P 3 (SUTURE) ×2 IMPLANT
SUT VIC AB 2-0 CT1 27 (SUTURE)
SUT VIC AB 2-0 CT1 TAPERPNT 27 (SUTURE) IMPLANT
SUT VIC AB 2-0 UR6 27 (SUTURE) IMPLANT
SUT VICRYL 0 TIES 12 18 (SUTURE) IMPLANT
SYR 20CC LL (SYRINGE) IMPLANT
SYR 3ML 23GX1 SAFETY (SYRINGE) IMPLANT
SYR 50ML LL SCALE MARK (SYRINGE) IMPLANT
SYR 5ML LL (SYRINGE) IMPLANT
SYR CONTROL 10ML LL (SYRINGE) ×2 IMPLANT
SYRINGE 12CC LL (MISCELLANEOUS) ×2 IMPLANT
SYS LAPSCP GELPORT 120MM (MISCELLANEOUS)
SYSTEM LAPSCP GELPORT 120MM (MISCELLANEOUS) IMPLANT
TOWEL OR 17X24 6PK STRL BLUE (TOWEL DISPOSABLE) ×4 IMPLANT
TRAY DSU PREP LF (CUSTOM PROCEDURE TRAY) ×2 IMPLANT
TROCAR OPTI TIP 5M 100M (ENDOMECHANICALS) ×4 IMPLANT
TROCAR XCEL DIL TIP R 11M (ENDOMECHANICALS) IMPLANT
TUBING INSUFFLATION 10FT LAP (TUBING) ×2 IMPLANT
WARMER LAPAROSCOPE (MISCELLANEOUS) ×2 IMPLANT
WATER STERILE IRR 500ML POUR (IV SOLUTION) ×1 IMPLANT

## 2013-12-03 NOTE — Transfer of Care (Signed)
Immediate Anesthesia Transfer of Care Note  Patient: Teresa Brock  Procedure(s) Performed: Procedure(s) (LRB): LAPAROSCOPY OPERATIVE,LYSIS OF ADHESION, LEFT SALPINGECTOMY, RIGHT SALPINGONEOSTOMY (Bilateral)  Patient Location: PACU  Anesthesia Type: General  Level of Consciousness: awake, oriented, sedated and patient cooperative  Airway & Oxygen Therapy: Patient Spontanous Breathing and Patient connected to face mask oxygen  Post-op Assessment: Report given to PACU RN and Post -op Vital signs reviewed and stable  Post vital signs: Reviewed and stable  Complications: No apparent anesthesia complications

## 2013-12-03 NOTE — H&P (Addendum)
Teresa Brock is a 38 y.o. female , originally referred to me by Dr. Melba Coon, for infertility.  She was diagnosed with bilateral large hydrosalpinges by a recent hysterosalpingogram (The left tube distends into 2.5 cm hydrosalpinx without spillage.  The right tube also distends into 2.5-3 cm hydrosalpinx with probable minimal spillage).  Patient would like to preserve her childbearing potential and pursue in vitro fertilization.  Pertinent Gynecological History: Menses: normal Bleeding: normal Contraception: none DES exposure: denies Blood transfusions: none Sexually transmitted diseases: no past history Previous GYN Procedures: none  Last mammogram: n/a Last pap: normal  OB History: G 0   Menstrual History: Menarche age: 79 No LMP recorded.    Past Medical History  Diagnosis Date  . GERD (gastroesophageal reflux disease)   . 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 Hydrosalpinx     bilateral  . Wears contact lenses                     Past Surgical History  Procedure Laterality Date  . Wisdom tooth extraction  age 35             Family History  Problem Relation Age of Onset  . Diabetes Father   . Hypertension Father   . Cancer Father     prostate/melonoma  . Heart attack Father 50  . Hypertension Mother    No hereditary disease.  No cancer of breast, ovary, uterus. No cutaneous leiomyomatosis or renal cell carcinoma.  History   Social History  . Marital Status: Married    Spouse Name: N/A    Number of Children: N/A  . Years of Education: N/A   Occupational History  . Geophysical data processor for [employer] History Main Topics  . Smoking status: Former Smoker -- 0.25 packs/day for 10 years    Types: Cigarettes    Quit date: 12/02/2003  . Smokeless tobacco: Never Used  . Alcohol Use: Yes     Comment: social  . Drug Use: No  . Sexual Activity: Not on file   Other Topics Concern  . Not on file   Social History Narrative   Regular exercise - yes -- daily walking    No Known Allergies  No current facility-administered medications on file prior to encounter.   No current outpatient prescriptions on file prior to encounter.     Review of Systems  Constitutional: Negative.   HENT: Negative.   Eyes: Negative.   Respiratory: Negative.   Cardiovascular: Negative.   Gastrointestinal: Negative.   Genitourinary: Negative.   Musculoskeletal: Negative.   Skin: Negative.   Neurological: Negative.   Endo/Heme/Allergies: Negative.   Psychiatric/Behavioral: Negative.      Physical Exam  Ht 5\' 7"  (1.702 m)  Wt 72.576 kg (160 lb)  BMI 25.05 kg/m2  LMP 11/27/2013 (Exact Date) Constitutional: She is oriented to person, place, and time. She appears well-developed and well-nourished.  HENT:  Head: Normocephalic and atraumatic.  Nose: Nose normal.  Mouth/Throat: Oropharynx is clear and moist. No oropharyngeal exudate.  Eyes: Conjunctivae normal and EOM are normal. Pupils are equal, round, and reactive to light. No scleral icterus.  Neck: Normal range of motion. Neck supple. No tracheal deviation present. No thyromegaly present.  Cardiovascular: Normal rate.   Respiratory: Effort normal and breath sounds normal.  GI: Soft. Bowel sounds are normal. She exhibits no distension and no mass. There is no tenderness.  Lymphadenopathy:    She has no cervical adenopathy.  Neurological:  She is alert and oriented to person, place, and time. She has normal reflexes.  Skin: Skin is warm.  Psychiatric: She has a normal mood and affect. Her behavior is normal. Judgment and thought content normal.     Assessment/Plan:  Laparoscopy and lysis of adhesions with bilateral salpingectomy vs salpingoneostomy for the better of the tubes, and salpingectomy for the worse hydrosalpinx . Pt agrees and will move forward with surgery.  Patient understands that pregnancy rate after tubal repair is 20% per year, with 40% of them being in  ectopic location.  Governor Specking \

## 2013-12-03 NOTE — Op Note (Signed)
Operative Note  Preoperative diagnosis: Bilateral hydrosalpinx, infertility  Postoperative diagnosis: Bilateral hydrosalpinx, pelvic adhesions, infertility, probable endometriosis of the left fallopian tube (pathology pending)  Procedure: Laparoscopy, lysis of adhesions, left salpingectomy, right salpingo-neostomy, chromotubation   \Anesthesia: Gen. endotracheal  Complications: None  Estimated blood loss: <10 cc  Specimens: pelvic adhesions, left fallopian tube to pathology  Findings: on exam under anesthesia, external genitalia, Bartholin's, urethra, and Skene's were within normal limits. Vagina was normal. The cervix appeared normal. The uterus sounded to 7.5 cm. The uterine corpus was retroverted, normal size, and mobile. There were no posterior fornix nodularities. There were no adnexal masses. On laparoscopy, upper abdomen, liver surface and diaphragm surfaces were normal. There were no perihepatic adhesions. Gallbladder was normal. The appendix was visualized and appeared normal. The pelvic peritoneum looked mostly normal in the anterior cul-de-sac. The uterus appeared normal. There was firm swelling over the proximal end of the left tube, suggestive of salpingitis isthmic nodosa. The rest of the tube was bound down to the pelvic sidewall with filmy adhesions and showed large hydrosalpinx, about 2.5 cm wide. It was convoluted. The left ovary was grossly normal. The posterior cul-de-sac contained many filmy adhesions, dividing it into multiple compartments. The right tube also showed a large, 3 cm wide, hydrosalpinx that was convoluted but without peritubal adhesions and with a thin wall. Its proximal portion did not show thickening or evidence ofSIN.   The right ovary was completely encased in filmy adhesions but was not adherent to the pelvic sidewall. There were no peritoneal lesions of endometriosis.  Description of the procedure: The patient was placed in dorsal supine position and  general endotracheal anesthesia was given. 2 g of cefazolin were given intravenously for prophylaxis. Patient was placed in lithotomy position. She was prepped and draped inside manner.  The bladder was straight catheterized . A ZUMI catheter was placed into the uterine cavity. Uterus sounded to 7.5 cm.The surgeon was regloved and a surgical field was created on the abdomen.  After preemptive anesthesia of all surgical sites with 0.25% bupivacaine with 1 200,000 epinephrine, a 5 mm intraumbilical skin incision was made and a Verress needle was inserted. Its correct location was confirmed. A pneumoperitoneum was created with carbon dioxide.  5 mm laparoscope with a 30 lens was inserted and video laparoscopy was started . A left lower quadrant 5 mm and a right lower quadrant  5 mm incisions were made and ancillary trochars were placed under direct visualization. Above findings were noted.   Using a needle electrode on 28 W of cutting current, all of the posterior cul-de-sac adhesions were excised. The left tube was freed up. Because of the presence of bipolar tubal disease, decision was made to do a left salpingectomy. This was accomplished by successive applications of Harmonic Ace very close to the tubal muscularis, staying away from the anastomosing vessels between the tubal and ovarian circulation, maximum coagulation and cutting. The tube was then severed at the uterotubal junction with the application of Harmonic Ace. Next we excised the right periovarian adhesions. A dilute solution of vasopressin (0.4 units per mL) was injected to the right mesosalpinx after deciding that a salpingoneostomy is feasible on the side. Enlarged stellate incision was made on the tip of the right hydrosalpinx, using needle electrode in cutting mode to score and using hook scissors to enter the lumen of the tube. Methylene blue injection confirmed patency of the right tube at this point. The resulting for flaps were inverted  back  onto the tubal serosa and sutured with 5-0 Prolene sutures. The pelvis was copiously irrigated and aspirated. The right tube was wrapped with Interceed as an adhesion barrier. The gas was allowed to escape. The instruments were removed. The instrument and needle count were correct. 4-0 Monocryl sutures were placed subcuticularly on all of the 3 incisions. The patient tolerated the procedure well and was transferred to recovery room in satisfactory condition.  Governor Specking

## 2013-12-03 NOTE — Discharge Instructions (Addendum)
° ° ° ° °  Post Anesthesia Home Care Instructions  Activity: Get plenty of rest for the remainder of the day. A responsible adult should stay with you for 24 hours following the procedure.  For the next 24 hours, DO NOT: -Drive a car -Paediatric nurse -Drink alcoholic beverages -Take any medication unless instructed by your physician -Make any legal decisions or sign important papers.  Meals: Start with liquid foods such as gelatin or soup. Progress to regular foods as tolerated. Avoid greasy, spicy, heavy foods. If nausea and/or vomiting occur, drink only clear liquids until the nausea and/or vomiting subsides. Call your physician if vomiting continues.  Special Instructions/Symptoms: Your throat may feel dry or sore from the anesthesia or the breathing tube placed in your throat during surgery. If this causes discomfort, gargle with warm salt water. The discomfort should disappear within 24 hours.  Salpingectomy, Care After Refer to this sheet in the next few weeks. These instructions provide you with information on caring for yourself after your procedure. Your health care provider may also give you more specific instructions. Your treatment has been planned according to current medical practices, but problems sometimes occur. Call your health care provider if you have any problems or questions after your procedure. WHAT TO EXPECT AFTER THE PROCEDURE After your procedure, it is typical to have the following:  Abdominal pain that can be controlled with pain medicine.  Vaginal spotting.  Tiredness. HOME CARE INSTRUCTIONS  Get plenty of rest and sleep.  Only take over-the-counter or prescription medicines as directed by your health care provider.  Keep incision areas clean and dry. Remove or change any bandages (dressings) only as directed by your health care provider.  You may resume your regular diet. Eat a well-balanced diet.  Drink enough fluids to keep your urine clear or  pale yellow.  Limit exercise and activities as directed by your health care provider. Do not lift anything heavier than 5 lb (2.3 kg) until your health care provider approves.  Do not drive until your health care provider approves.  Do not have sexual intercourse until your health care provider says it is okay.  Take your temperature twice a day for the first week. Write those temperatures down.  Follow up with your health care provider as directed. SEEK MEDICAL CARE IF:  You have pain when you urinate.  You see pus coming out of the incision, or the incision is separating.  You have increasing abdominal pain.  You have swelling or redness in the incision area.  You develop a rash.  You feel lightheaded.  You have pain that is not controlled with medicine. SEEK IMMEDIATE MEDICAL CARE IF:  You develop a fever.  You have increasing abdominal pain.  You develop chest or leg pain.  You develop shortness of breath.  You pass out. Document Released: 04/15/2010 Document Revised: 10/30/2012 Document Reviewed: 07/03/2012 [facility] Patient Information 2015 [place], [place]. This information is not intended to replace advice given to you by your health care provider. Make sure you discuss any questions you have with your health care provider.

## 2013-12-03 NOTE — Anesthesia Preprocedure Evaluation (Signed)
Anesthesia Evaluation  Patient identified by MRN, date of birth, ID band Patient awake    Reviewed: Allergy & Precautions, H&P , NPO status , Patient's Chart, lab work & pertinent test results  Airway Mallampati: II  TM Distance: >3 FB Neck ROM: Full    Dental no notable dental hx.    Pulmonary neg pulmonary ROS, former smoker,  breath sounds clear to auscultation  Pulmonary exam normal       Cardiovascular negative cardio ROS  Rhythm:Regular Rate:Normal     Neuro/Psych negative neurological ROS  negative psych ROS   GI/Hepatic Neg liver ROS, GERD-  Medicated,  Endo/Other  negative endocrine ROS  Renal/GU negative Renal ROS  negative genitourinary   Musculoskeletal negative musculoskeletal ROS (+)   Abdominal   Peds negative pediatric ROS (+)  Hematology negative hematology ROS (+)   Anesthesia Other Findings   Reproductive/Obstetrics negative OB ROS                             Anesthesia Physical Anesthesia Plan  ASA: II  Anesthesia Plan: General   Post-op Pain Management:    Induction: Intravenous  Airway Management Planned: Oral ETT  Additional Equipment:   Intra-op Plan:   Post-operative Plan: Extubation in OR  Informed Consent: I have reviewed the patients History and Physical, chart, labs and discussed the procedure including the risks, benefits and alternatives for the proposed anesthesia with the patient or authorized representative who has indicated his/her understanding and acceptance.   Dental advisory given  Plan Discussed with: CRNA and Surgeon  Anesthesia Plan Comments:         Anesthesia Quick Evaluation

## 2013-12-03 NOTE — Anesthesia Procedure Notes (Signed)
Procedure Name: Intubation Date/Time: 12/03/2013 1:45 PM Performed by: Denna Haggard D Pre-anesthesia Checklist: Patient identified, Emergency Drugs available, Suction available and Patient being monitored Patient Re-evaluated:Patient Re-evaluated prior to inductionOxygen Delivery Method: Circle System Utilized Preoxygenation: Pre-oxygenation with 100% oxygen Intubation Type: IV induction Ventilation: Mask ventilation without difficulty Laryngoscope Size: Mac and 3 Grade View: Grade I Tube type: Oral Tube size: 7.0 mm Number of attempts: 1 Airway Equipment and Method: stylet,  oral airway and LTA kit utilized Placement Confirmation: ETT inserted through vocal cords under direct vision,  positive ETCO2 and breath sounds checked- equal and bilateral Secured at: 21 cm Tube secured with: Tape Dental Injury: Teeth and Oropharynx as per pre-operative assessment

## 2013-12-04 ENCOUNTER — Encounter (HOSPITAL_BASED_OUTPATIENT_CLINIC_OR_DEPARTMENT_OTHER): Payer: Self-pay | Admitting: Obstetrics and Gynecology

## 2013-12-05 NOTE — Anesthesia Postprocedure Evaluation (Signed)
  Anesthesia Post-op Note  Patient: Teresa Brock  Procedure(s) Performed: Procedure(s) (LRB): LAPAROSCOPY OPERATIVE,LYSIS OF ADHESION, LEFT SALPINGECTOMY, RIGHT SALPINGONEOSTOMY (Bilateral)  Patient Location: PACU  Anesthesia Type: General  Level of Consciousness: awake and alert   Airway and Oxygen Therapy: Patient Spontanous Breathing  Post-op Pain: mild  Post-op Assessment: Post-op Vital signs reviewed, Patient's Cardiovascular Status Stable, Respiratory Function Stable, Patent Airway and No signs of Nausea or vomiting  Last Vitals:  Filed Vitals:   12/03/13 1819  BP: 118/63  Pulse: 55  Temp: 36.7 C  Resp: 16    Post-op Vital Signs: stable   Complications: No apparent anesthesia complications

## 2014-08-25 ENCOUNTER — Telehealth: Payer: Self-pay | Admitting: Family Medicine

## 2014-08-25 NOTE — Telephone Encounter (Signed)
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  Dr.Bedsole's first available for a physical is 12/09/14.  Can patient be seen for the physical before 10/23/14 or can she see another provider?  Patient is aware Dr.Bedsole is on vacation this week.

## 2014-08-25 NOTE — Telephone Encounter (Signed)
Ok to put in a 30 minute slot with Dr. Diona Browner.

## 2014-08-25 NOTE — Telephone Encounter (Signed)
I left a message on patient's voice mail to call back and schedule appointment. 

## 2014-08-26 NOTE — Telephone Encounter (Signed)
Agreed -

## 2014-09-13 ENCOUNTER — Telehealth: Payer: Self-pay | Admitting: Family Medicine

## 2014-09-13 DIAGNOSIS — Z13220 Encounter for screening for lipoid disorders: Secondary | ICD-10-CM

## 2014-09-13 NOTE — Telephone Encounter (Signed)
-----   Message from Marchia Bond sent at 09/03/2014  2:57 PM EDT ----- Regarding: cpx labs 8/22, need orders please :-) Please order  future cpx labs for pt's upcoming lab appt. Thanks Aniceto Boss

## 2014-09-14 ENCOUNTER — Other Ambulatory Visit (INDEPENDENT_AMBULATORY_CARE_PROVIDER_SITE_OTHER): Payer: 59

## 2014-09-14 DIAGNOSIS — Z13220 Encounter for screening for lipoid disorders: Secondary | ICD-10-CM | POA: Diagnosis not present

## 2014-09-14 LAB — COMPREHENSIVE METABOLIC PANEL
ALK PHOS: 59 U/L (ref 39–117)
ALT: 14 U/L (ref 0–35)
AST: 16 U/L (ref 0–37)
Albumin: 4.2 g/dL (ref 3.5–5.2)
BUN: 9 mg/dL (ref 6–23)
CHLORIDE: 104 meq/L (ref 96–112)
CO2: 27 mEq/L (ref 19–32)
Calcium: 9.1 mg/dL (ref 8.4–10.5)
Creatinine, Ser: 0.81 mg/dL (ref 0.40–1.20)
GFR: 84.7 mL/min (ref >60.00)
Glucose, Bld: 103 mg/dL — ABNORMAL HIGH (ref 70–99)
POTASSIUM: 4.2 meq/L (ref 3.5–5.1)
SODIUM: 138 meq/L (ref 135–145)
Total Bilirubin: 0.6 mg/dL (ref 0.2–1.2)
Total Protein: 6.7 g/dL (ref 6.0–8.3)

## 2014-09-14 LAB — LIPID PANEL
Cholesterol: 176 mg/dL (ref 0–200)
HDL: 78.6 mg/dL (ref >39.00)
LDL CALC: 89 mg/dL (ref 0–99)
NonHDL: 97.55
TRIGLYCERIDES: 43 mg/dL (ref 0.0–149.0)
Total CHOL/HDL Ratio: 2
VLDL: 8.6 mg/dL (ref 0.0–40.0)

## 2014-09-22 ENCOUNTER — Ambulatory Visit (INDEPENDENT_AMBULATORY_CARE_PROVIDER_SITE_OTHER): Payer: 59 | Admitting: Family Medicine

## 2014-09-22 ENCOUNTER — Encounter: Payer: Self-pay | Admitting: Family Medicine

## 2014-09-22 VITALS — BP 120/78 | HR 69 | Temp 98.6°F | Ht 67.0 in | Wt 169.8 lb

## 2014-09-22 DIAGNOSIS — Z0000 Encounter for general adult medical examination without abnormal findings: Secondary | ICD-10-CM | POA: Diagnosis not present

## 2014-09-22 DIAGNOSIS — R7303 Prediabetes: Secondary | ICD-10-CM | POA: Insufficient documentation

## 2014-09-22 NOTE — Assessment & Plan Note (Signed)
Discussed low carb diet, exercise and weight loss. 

## 2014-09-22 NOTE — Progress Notes (Signed)
The patient is here for annual wellness exam and preventative care.   She has GYN.Marland Kitchen She has scheduled pap and breast exam in 11/2014.  Reviewed labs in detail.   Mild prediabetes.  Chol at goal on no med. Lab Results  Component Value Date   CHOL 176 09/14/2014   HDL 78.60 09/14/2014   LDLCALC 89 09/14/2014   TRIG 43.0 09/14/2014   CHOLHDL 2 09/14/2014   No specific issues. Doing well overall.  Exercise 3-4 times a week, walking/running. She runs for fun. Diet: healthy, water, good calcium intake. Takes daily multivitamin.  Wt Readings from Last 3 Encounters:  09/22/14 169 lb 12 oz (76.998 kg)  12/03/13 162 lb 8 oz (73.71 kg)  05/06/13 157 lb 12 oz (71.555 kg)   Body mass index is 26.58 kg/(m^2).  Review of Systems  Constitutional: Negative for chills and fatigue.  HENT: Negative for ear pain.  Eyes: Negative for pain.  Respiratory: Negative for cough, shortness of breath and wheezing.  Cardiovascular: Negative for chest pain, palpitations and leg swelling.  Gastrointestinal: Negative for abdominal pain, diarrhea, constipation and blood in stool.  Genitourinary: Negative for dysuria, urgency and frequency.  Musculoskeletal: Negative for myalgias.  Neurological: Negative for dizziness and numbness.  Psychiatric/Behavioral: Negative for dysphoric mood. The patient is not nervous/anxious.       Objective:   Physical Exam  Constitutional: Vital signs are normal. She appears well-developed and well-nourished. She is cooperative. Non-toxic appearance. She does not appear ill. No distress.  HENT:  Head: Normocephalic.  Right Ear: Hearing, tympanic membrane, external ear and ear canal normal.  Left Ear: Hearing, tympanic membrane, external ear and ear canal normal.  Nose: Nose normal.  Eyes: Conjunctivae, EOM and lids are normal. Pupils are equal, round, and reactive to light. Lids are everted and swept, no foreign bodies found.  Neck: Trachea normal and normal  range of motion. Neck supple. Carotid bruit is not present. No mass and no thyromegaly present.  Cardiovascular: Normal rate, regular rhythm, S1 normal, S2 normal, normal heart sounds and intact distal pulses. Exam reveals no gallop.  No murmur heard. Pulmonary/Chest: Effort normal and breath sounds normal. No respiratory distress. She has no wheezes. She has no rhonchi. She has no rales.  Abdominal: Soft. Normal appearance and bowel sounds are normal. She exhibits no distension, no fluid wave, no abdominal bruit and no mass. There is no hepatosplenomegaly. There is no tenderness. There is no rebound, no guarding and no CVA tenderness. No hernia.  Lymphadenopathy:   She has no cervical adenopathy.   She has no axillary adenopathy.  Neurological: She is alert. She has normal strength. No cranial nerve deficit or sensory deficit.  Skin: Skin is warm, dry and intact. No rash noted.  Psychiatric: Her speech is normal and behavior is normal. Judgment normal. Her mood appears not anxious. Cognition and memory are normal. She does not exhibit a depressed mood.          Assessment & Plan:  The patient's preventative maintenance and recommended screening tests for an annual wellness exam were reviewed in full today. Brought up to date unless services declined.  Counselled on the importance of diet, exercise, and its role in overall health and mortality. The patient's FH and SH was reviewed, including their home life, tobacco status, and drug and alcohol status.   Vacccines up to date. Nonsmoker Pelvic breast: sees GYN. STD testing: refused. No early colon cancer in family.

## 2014-09-22 NOTE — Patient Instructions (Signed)
Get back on track with healthy eating, low carbohydrate and regular exercise.

## 2014-09-22 NOTE — Progress Notes (Signed)
Pre visit review using our clinic review tool, if applicable. No additional management support is needed unless otherwise documented below in the visit note. 

## 2015-09-02 ENCOUNTER — Telehealth: Payer: Self-pay | Admitting: Family Medicine

## 2015-09-02 ENCOUNTER — Other Ambulatory Visit (INDEPENDENT_AMBULATORY_CARE_PROVIDER_SITE_OTHER): Payer: 59

## 2015-09-02 DIAGNOSIS — R7303 Prediabetes: Secondary | ICD-10-CM

## 2015-09-02 DIAGNOSIS — Z13220 Encounter for screening for lipoid disorders: Secondary | ICD-10-CM | POA: Diagnosis not present

## 2015-09-02 LAB — LIPID PANEL
CHOL/HDL RATIO: 2
Cholesterol: 179 mg/dL (ref 0–200)
HDL: 80.6 mg/dL (ref >39.00)
LDL CALC: 88 mg/dL (ref 0–99)
NonHDL: 98.34
TRIGLYCERIDES: 51 mg/dL (ref 0.0–149.0)
VLDL: 10.2 mg/dL (ref 0.0–40.0)

## 2015-09-02 LAB — COMPREHENSIVE METABOLIC PANEL
ALT: 14 U/L (ref 0–35)
AST: 16 U/L (ref 0–37)
Albumin: 4.2 g/dL (ref 3.5–5.2)
Alkaline Phosphatase: 61 U/L (ref 39–117)
BUN: 10 mg/dL (ref 6–23)
CO2: 28 meq/L (ref 19–32)
Calcium: 9.2 mg/dL (ref 8.4–10.5)
Chloride: 103 mEq/L (ref 96–112)
Creatinine, Ser: 0.81 mg/dL (ref 0.40–1.20)
GFR: 84.26 mL/min (ref >60.00)
Glucose, Bld: 97 mg/dL (ref 70–99)
Potassium: 4.1 mEq/L (ref 3.5–5.1)
Sodium: 136 mEq/L (ref 135–145)
Total Bilirubin: 0.6 mg/dL (ref 0.2–1.2)
Total Protein: 6.9 g/dL (ref 6.0–8.3)

## 2015-09-02 LAB — HEMOGLOBIN A1C: Hgb A1c MFr Bld: 5.3 % (ref 4.6–6.5)

## 2015-09-02 NOTE — Telephone Encounter (Signed)
-----   Message from Ellamae Sia sent at 08/31/2015  2:10 PM EDT ----- Regarding: Lab orders for Thursday, 8.10.17 Patient is scheduled for CPX labs, please order future labs, Thanks , Karna Christmas

## 2015-09-06 ENCOUNTER — Other Ambulatory Visit: Payer: 59

## 2015-09-17 ENCOUNTER — Ambulatory Visit (INDEPENDENT_AMBULATORY_CARE_PROVIDER_SITE_OTHER): Payer: 59 | Admitting: Family Medicine

## 2015-09-17 ENCOUNTER — Encounter: Payer: Self-pay | Admitting: Family Medicine

## 2015-09-17 VITALS — BP 136/69 | HR 61 | Temp 99.2°F | Ht 66.5 in | Wt 170.5 lb

## 2015-09-17 DIAGNOSIS — R7303 Prediabetes: Secondary | ICD-10-CM

## 2015-09-17 DIAGNOSIS — Z0000 Encounter for general adult medical examination without abnormal findings: Secondary | ICD-10-CM

## 2015-09-17 NOTE — Assessment & Plan Note (Signed)
Resolved. Encouraged exercise, weight loss, healthy eating habits.  

## 2015-09-17 NOTE — Progress Notes (Signed)
Pre visit review using our clinic review tool, if applicable. No additional management support is needed unless otherwise documented below in the visit note. 

## 2015-09-17 NOTE — Patient Instructions (Signed)
Increase exercise .Marland Kitchen 150 min per week.  Work on low fat low carbohydrate heart healthy.

## 2015-09-17 NOTE — Progress Notes (Signed)
The patient is here for annual wellness exam and preventative care.   She has GYN.Marland Kitchen She has scheduled pap and breast exam in 12/2015.  Reviewed labs in detail.   Mild prediabetes, resolved. A1C 5.3  Chol at goal on no med. Lab Results  Component Value Date   CHOL 179 09/02/2015   HDL 80.60 09/02/2015   LDLCALC 88 09/02/2015   TRIG 51.0 09/02/2015   CHOLHDL 2 09/02/2015   No specific issues. Doing well overall.  Exercise 3 times a week, walking/running. She runs for fun. Diet: healthy, water, good calcium intake. Takes daily multivitamin.  Wt Readings from Last 3 Encounters:  09/17/15 170 lb 8 oz (77.3 kg)  09/22/14 169 lb 12 oz (77 kg)  12/03/13 162 lb 8 oz (73.7 kg)  Body mass index is 27.11 kg/m.  Review of Systems  Constitutional: Negative for chills and fatigue.  HENT: Negative for ear pain.  Eyes: Negative for pain.  Respiratory: Negative for cough, shortness of breath and wheezing.  Cardiovascular: Negative for chest pain, palpitations and leg swelling.  Gastrointestinal: Negative for abdominal pain, diarrhea, constipation and blood in stool.  Genitourinary: Negative for dysuria, urgency and frequency.  Musculoskeletal: Negative for myalgias.  Neurological: Negative for dizziness and numbness.  Psychiatric/Behavioral: Negative for dysphoric mood. The patient is not nervous/anxious.       Objective:   Physical Exam  Constitutional: Vital signs are normal. She appears well-developed and well-nourished. She is cooperative. Non-toxic appearance. She does not appear ill. No distress.  HENT:  Head: Normocephalic.  Right Ear: Hearing, tympanic membrane, external ear and ear canal normal.  Left Ear: Hearing, tympanic membrane, external ear and ear canal normal.  Nose: Nose normal.  Eyes: Conjunctivae, EOM and lids are normal. Pupils are equal, round, and reactive to light. Lids are everted and swept, no foreign bodies found.  Neck: Trachea normal and  normal range of motion. Neck supple. Carotid bruit is not present. No mass and no thyromegaly present.  Cardiovascular: Normal rate, regular rhythm, S1 normal, S2 normal, normal heart sounds and intact distal pulses. Exam reveals no gallop.  No murmur heard. Pulmonary/Chest: Effort normal and breath sounds normal. No respiratory distress. She has no wheezes. She has no rhonchi. She has no rales.  Abdominal: Soft. Normal appearance and bowel sounds are normal. She exhibits no distension, no fluid wave, no abdominal bruit and no mass. There is no hepatosplenomegaly. There is no tenderness. There is no rebound, no guarding and no CVA tenderness. No hernia.  Lymphadenopathy:   She has no cervical adenopathy.   She has no axillary adenopathy.  Neurological: She is alert. She has normal strength. No cranial nerve deficit or sensory deficit.  Skin: Skin is warm, dry and intact. No rash noted.  Psychiatric: Her speech is normal and behavior is normal. Judgment normal. Her mood appears not anxious. Cognition and memory are normal. She does not exhibit a depressed mood.          Assessment & Plan:  The patient's preventative maintenance and recommended screening tests for an annual wellness exam were reviewed in full today. Brought up to date unless services declined.  Counselled on the importance of diet, exercise, and its role in overall health and mortality. The patient's FH and SH was reviewed, including their home life, tobacco status, and drug and alcohol status.   Vacccines up to date. Refused flu. Nonsmoker Pelvic breast: sees GYN. STD testing: refused. No early colon cancer in family.

## 2016-04-27 ENCOUNTER — Encounter (INDEPENDENT_AMBULATORY_CARE_PROVIDER_SITE_OTHER): Payer: Self-pay

## 2016-04-27 ENCOUNTER — Encounter: Payer: Self-pay | Admitting: Internal Medicine

## 2016-04-27 ENCOUNTER — Ambulatory Visit (INDEPENDENT_AMBULATORY_CARE_PROVIDER_SITE_OTHER): Payer: 59 | Admitting: Internal Medicine

## 2016-04-27 VITALS — BP 136/82 | HR 67 | Temp 98.0°F | Wt 174.8 lb

## 2016-04-27 DIAGNOSIS — F411 Generalized anxiety disorder: Secondary | ICD-10-CM | POA: Diagnosis not present

## 2016-04-27 MED ORDER — SERTRALINE HCL 50 MG PO TABS: 25.0000 mg | ORAL_TABLET | Freq: Every day | ORAL | 2 refills | Status: DC

## 2016-04-27 NOTE — Progress Notes (Signed)
Subjective:    Patient ID: Herbert Seta, female    DOB: Nov 18, 1975, 41 y.o.   MRN: [id number]  HPI  Pt presents to the clinic today with c/o anxiety. She reports she has always felt anxious but just felt like this was part of her personality. She reports she has always been an excitable person. She is type A, detail oriented, but feels like those behaviors are getting out of hand. She has trouble relaxing and worries all the time. She feels panicky at times. She has trouble sleeping at night because she can not turn her brain off. She denies depression. She has never been treated for anxiety before. A lot of her family members struggle with anxiety, and are taking Zoloft. She has tried meditation, distraction which helps for a short time but does not give her long lasting relief.   Review of Systems      Past Medical History:  Diagnosis Date  . 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 GERD (gastroesophageal reflux disease)   . Hydrosalpinx    bilateral  . Wears contact lenses     Current Outpatient Prescriptions  Medication Sig Dispense Refill  . b complex vitamins tablet Take 1 tablet by mouth daily.    . famotidine (PEPCID AC) 10 MG chewable tablet Chew 10 mg by mouth as needed for heartburn.    . Multiple Vitamin (MULTIVITAMIN) tablet Take 1 tablet by mouth daily.    . vitamin E 400 UNIT capsule Take 400 Units by mouth daily.     No current facility-administered medications for this visit.     No Known Allergies  Family History  Problem Relation Age of Onset  . Diabetes Father   . Hypertension Father   . Cancer Father     prostate/melonoma  . Heart attack Father 7  . Hypertension Mother     Social History   Social History  . Marital status: Married    Spouse name: N/A  . Number of children: N/A  . Years of education: N/A   Occupational History  . Geophysical data processor for [employer] History Main Topics  . Smoking status: Former Smoker     Packs/day: 0.25    Years: 10.00    Types: Cigarettes    Quit date: 12/02/2003  . Smokeless tobacco: Never Used  . Alcohol use 0.0 oz/week     Comment: social  . Drug use: No  . Sexual activity: Not on file   Other Topics Concern  . Not on file   Social History Narrative   Regular exercise - yes -- daily walking     Constitutional: Denies fever, malaise, fatigue, headache or abrupt weight changes.  Psych: Pt reports anxiety. Denies depression, SI/HI.  No other specific complaints in a complete review of systems (except as listed in HPI above).  Objective:   Physical Exam   BP 136/82   Pulse 67   Temp 98 F (36.7 C) (Oral)   Wt 174 lb 12 oz (79.3 kg)   LMP 04/25/2016   SpO2 100%   BMI 27.78 kg/m  Wt Readings from Last 3 Encounters:  04/27/16 174 lb 12 oz (79.3 kg)  09/17/15 170 lb 8 oz (77.3 kg)  09/22/14 169 lb 12 oz (77 kg)    General: Appears her stated age, well developed, well nourished in NAD. Neurological: Alert and oriented.  Psychiatric: She is mildly anxious appearing. Behavior is normal. Judgment and thought content normal.  BMET    Component Value Date/Time   NA 136 09/02/2015 0852   K 4.1 09/02/2015 0852   CL 103 09/02/2015 0852   CO2 28 09/02/2015 0852   GLUCOSE 97 09/02/2015 0852   BUN 10 09/02/2015 0852   CREATININE 0.81 09/02/2015 0852   CALCIUM 9.2 09/02/2015 0852   GFRNONAA 88.84 02/19/2009 0855    Lipid Panel     Component Value Date/Time   CHOL 179 09/02/2015 0852   TRIG 51.0 09/02/2015 0852   HDL 80.60 09/02/2015 0852   CHOLHDL 2 09/02/2015 0852   VLDL 10.2 09/02/2015 0852   LDLCALC 88 09/02/2015 0852    CBC    Component Value Date/Time   WBC 3.9 (L) 02/19/2009 0855   RBC 4.36 02/19/2009 0855   HGB 13.2 12/03/2013 1244   HCT 40.7 02/19/2009 0855   PLT 188.0 02/19/2009 0855   MCV 93.4 02/19/2009 0855   MCHC 33.4 02/19/2009 0855   RDW 11.9 02/19/2009 0855   LYMPHSABS 1.3 02/19/2009 0855   MONOABS 0.3 02/19/2009  0855   EOSABS 0.1 02/19/2009 0855   BASOSABS 0.0 02/19/2009 0855    Hgb A1C Lab Results  Component Value Date   HGBA1C 5.3 09/02/2015           Assessment & Plan:   Anxiety:  GAD score 17/21 Discussed treatment options, therapy and medications eRx for Zoloft 50 mg tabs, cut in half- take one daily Side effects discussed  Followup in 4-6 weeks via Armanda Heritage, NP

## 2016-04-27 NOTE — Patient Instructions (Signed)
Generalized Anxiety Disorder, Adult Generalized anxiety disorder (GAD) is a mental health disorder. People with this condition constantly worry about everyday events. Unlike normal anxiety, worry related to GAD is not triggered by a specific event. These worries also do not fade or get better with time. GAD interferes with life functions, including relationships, work, and school. GAD can vary from mild to severe. People with severe GAD can have intense waves of anxiety with physical symptoms (panic attacks). What are the causes? The exact cause of GAD is not known. What increases the risk? This condition is more likely to develop in:  Women.  People who have a family history of anxiety disorders.  People who are very shy.  People who experience very stressful life events, such as the death of a loved one.  People who have a very stressful family environment. What are the signs or symptoms? People with GAD often worry excessively about many things in their lives, such as their health and family. They may also be overly concerned about:  Doing well at work.  Being on time.  Natural disasters.  Friendships. Physical symptoms of GAD include:  Fatigue.  Muscle tension or having muscle twitches.  Trembling or feeling shaky.  Being easily startled.  Feeling like your heart is pounding or racing.  Feeling out of breath or like you cannot take a deep breath.  Having trouble falling asleep or staying asleep.  Sweating.  Nausea, diarrhea, or irritable bowel syndrome (IBS).  Headaches.  Trouble concentrating or remembering facts.  Restlessness.  Irritability. How is this diagnosed? Your health care provider can diagnose GAD based on your symptoms and medical history. You will also have a physical exam. The health care provider will ask specific questions about your symptoms, including how severe they are, when they started, and if they come and go. Your health care  provider may ask you about your use of alcohol or drugs, including prescription medicines. Your health care provider may refer you to a mental health specialist for further evaluation. Your health care provider will do a thorough examination and may perform additional tests to rule out other possible causes of your symptoms. To be diagnosed with GAD, a person must have anxiety that:  Is out of his or her control.  Affects several different aspects of his or her life, such as work and relationships.  Causes distress that makes him or her unable to take part in normal activities.  Includes at least three physical symptoms of GAD, such as restlessness, fatigue, trouble concentrating, irritability, muscle tension, or sleep problems. Before your health care provider can confirm a diagnosis of GAD, these symptoms must be present more days than they are not, and they must last for six months or longer. How is this treated? The following therapies are usually used to treat GAD:  Medicine. Antidepressant medicine is usually prescribed for long-term daily control. Antianxiety medicines may be added in severe cases, especially when panic attacks occur.  Talk therapy (psychotherapy). Certain types of talk therapy can be helpful in treating GAD by providing support, education, and guidance. Options include:  Cognitive behavioral therapy (CBT). People learn coping skills and techniques to ease their anxiety. They learn to identify unrealistic or negative thoughts and behaviors and to replace them with positive ones.  Acceptance and commitment therapy (ACT). This treatment teaches people how to be mindful as a way to cope with unwanted thoughts and feelings.  Biofeedback. This process trains you to manage your body's response (  physiological response) through breathing techniques and relaxation methods. You will work with a therapist while machines are used to monitor your physical symptoms.  Stress  management techniques. These include yoga, meditation, and exercise. A mental health specialist can help determine which treatment is best for you. Some people see improvement with one type of therapy. However, other people require a combination of therapies. Follow these instructions at home:  Take over-the-counter and prescription medicines only as told by your health care provider.  Try to maintain a normal routine.  Try to anticipate stressful situations and allow extra time to manage them.  Practice any stress management or self-calming techniques as taught by your health care provider.  Do not punish yourself for setbacks or for not making progress.  Try to recognize your accomplishments, even if they are small.  Keep all follow-up visits as told by your health care provider. This is important. Contact a health care provider if:  Your symptoms do not get better.  Your symptoms get worse.  You have signs of depression, such as:  A persistently sad, cranky, or irritable mood.  Loss of enjoyment in activities that used to bring you joy.  Change in weight or eating.  Changes in sleeping habits.  Avoiding friends or family members.  Loss of energy for normal tasks.  Feelings of guilt or worthlessness. Get help right away if:  You have serious thoughts about hurting yourself or others. If you ever feel like you may hurt yourself or others, or have thoughts about taking your own life, get help right away. You can go to your nearest emergency department or call:  Your local emergency services (911 in the [place]  A suicide crisis helpline, such as the National Suicide Prevention Lifeline at 1-800-273-8255. This is open 24 hours a day. Summary  Generalized anxiety disorder (GAD) is a mental health disorder that involves worry that is not triggered by a specific event.  People with GAD often worry excessively about many things in their lives, such as their health and  family.  GAD may cause physical symptoms such as restlessness, trouble concentrating, sleep problems, frequent sweating, nausea, diarrhea, headaches, and trembling or muscle twitching.  A mental health specialist can help determine which treatment is best for you. Some people see improvement with one type of therapy. However, other people require a combination of therapies. This information is not intended to replace advice given to you by your health care provider. Make sure you discuss any questions you have with your health care provider. Document Released: 05/06/2012 Document Revised: 11/30/2015 Document Reviewed: 11/30/2015 Elsevier Interactive Patient Education  2017 [employer]  

## 2016-04-27 NOTE — Progress Notes (Signed)
Pre visit review using our clinic review tool, if applicable. No additional management support is needed unless otherwise documented below in the visit note. 

## 2016-05-16 ENCOUNTER — Encounter: Payer: Self-pay | Admitting: Internal Medicine

## 2016-05-27 ENCOUNTER — Encounter: Payer: Self-pay | Admitting: Internal Medicine

## 2016-05-29 MED ORDER — SERTRALINE HCL 50 MG PO TABS: 50.0000 mg | ORAL_TABLET | Freq: Every day | ORAL | 5 refills | Status: DC

## 2016-07-06 ENCOUNTER — Encounter: Payer: 59 | Admitting: Family Medicine

## 2016-08-17 ENCOUNTER — Ambulatory Visit (INDEPENDENT_AMBULATORY_CARE_PROVIDER_SITE_OTHER): Payer: 59 | Admitting: Family Medicine

## 2016-08-17 ENCOUNTER — Encounter: Payer: Self-pay | Admitting: Family Medicine

## 2016-08-17 VITALS — BP 112/72 | HR 67 | Temp 98.8°F | Ht 66.5 in | Wt 181.5 lb

## 2016-08-17 DIAGNOSIS — Z0000 Encounter for general adult medical examination without abnormal findings: Secondary | ICD-10-CM

## 2016-08-17 DIAGNOSIS — Z13220 Encounter for screening for lipoid disorders: Secondary | ICD-10-CM | POA: Diagnosis not present

## 2016-08-17 DIAGNOSIS — F411 Generalized anxiety disorder: Secondary | ICD-10-CM | POA: Insufficient documentation

## 2016-08-17 DIAGNOSIS — R7303 Prediabetes: Secondary | ICD-10-CM

## 2016-08-17 NOTE — Assessment & Plan Note (Signed)
Improved control on ssri. Continue.

## 2016-08-17 NOTE — Patient Instructions (Addendum)
Work on increasing exercise to 3-5 times a week. We will call with lab results.

## 2016-08-17 NOTE — Progress Notes (Signed)
Subjective:    Patient ID: Teresa Brock, female    DOB: [birth year], 41 y.o.   MRN: [id number]  HPI   The patient is here for annual wellness exam and preventative care.    Next GYN OV in 11/2016  4 reviewed labs in detail  Lab Results  Component Value Date   CHOL 179 09/02/2015   HDL 80.60 09/02/2015   LDLCALC 88 09/02/2015   TRIG 51.0 09/02/2015   CHOLHDL 2 09/02/2015    Body mass index is 28.86 kg/m.   Prediabetes  resolved  Diet: Good diet, avoiding high carb, red meat Exercise: minimal  ETOH: 1 drink a Day socially nonsmoker   GAD: improved control on sertraline 50 mg in last 3 months.  Able to handle stressor better.  Sleeping okay at night.  No SI.  Social History /Family History/Past Medical History reviewed in detail and updated in EMR if needed. Blood pressure 112/72, pulse 67, temperature 98.8 F (37.1 C), temperature source Oral, height 5' 6.5" (1.689 m), weight 181 lb 8 oz (82.3 kg), last menstrual period 08/15/2016.   Review of Systems  Constitutional: Negative for fatigue and fever.  HENT: Negative for congestion.   Eyes: Negative for pain.  Respiratory: Negative for cough and shortness of breath.   Cardiovascular: Negative for chest pain, palpitations and leg swelling.  Gastrointestinal: Negative for abdominal pain.  Genitourinary: Negative for dysuria and vaginal bleeding.  Musculoskeletal: Negative for back pain.  Neurological: Negative for syncope, light-headedness and headaches.  Psychiatric/Behavioral: Negative for dysphoric mood.       Objective:   Physical Exam  Constitutional: Vital signs are normal. She appears well-developed and well-nourished. She is cooperative.  Non-toxic appearance. She does not appear ill. No distress.  overweight  HENT:  Head: Normocephalic.  Right Ear: Hearing, tympanic membrane, external ear and ear canal normal. Tympanic membrane is not erythematous, not retracted and not bulging.  Left Ear:  Hearing, tympanic membrane, external ear and ear canal normal. Tympanic membrane is not erythematous, not retracted and not bulging.  Nose: Nose normal. No mucosal edema or rhinorrhea. Right sinus exhibits no maxillary sinus tenderness and no frontal sinus tenderness. Left sinus exhibits no maxillary sinus tenderness and no frontal sinus tenderness.  Mouth/Throat: Uvula is midline, oropharynx is clear and moist and mucous membranes are normal.  Eyes: Pupils are equal, round, and reactive to light. Conjunctivae, EOM and lids are normal. Lids are everted and swept, no foreign bodies found.  Neck: Trachea normal and normal range of motion. Neck supple. Carotid bruit is not present. No thyroid mass and no thyromegaly present.  Cardiovascular: Normal rate, regular rhythm, S1 normal, S2 normal, normal heart sounds, intact distal pulses and normal pulses.  Exam reveals no gallop and no friction rub.   No murmur heard. Pulmonary/Chest: Effort normal and breath sounds normal. No tachypnea. No respiratory distress. She has no decreased breath sounds. She has no wheezes. She has no rhonchi. She has no rales.  Abdominal: Soft. Normal appearance and bowel sounds are normal. She exhibits no distension, no fluid wave, no abdominal bruit and no mass. There is no hepatosplenomegaly. There is no tenderness. There is no rebound, no guarding and no CVA tenderness. No hernia.  Lymphadenopathy:    She has no cervical adenopathy.    She has no axillary adenopathy.  Neurological: She is alert. She has normal strength. No cranial nerve deficit or sensory deficit.  Skin: Skin is warm, dry and intact. No rash noted.  Psychiatric:  Her speech is normal and behavior is normal. Judgment and thought content normal. Her mood appears not anxious. Cognition and memory are normal. She does not exhibit a depressed mood.          Assessment & Plan:  The patient's preventative maintenance and recommended screening tests for an  annual wellness exam were reviewed in full today. Brought up to date unless services declined.  Counselled on the importance of diet, exercise, and its role in overall health and mortality. The patient's FH and SH was reviewed, including their home life, tobacco status, and drug and alcohol status.   Vacccines up to date.  former smoker Pelvic breast: sees GYN. STD testing: refused. No early colon cancer in family

## 2016-08-18 ENCOUNTER — Encounter: Payer: Self-pay | Admitting: *Deleted

## 2016-08-18 LAB — COMPREHENSIVE METABOLIC PANEL
ALBUMIN: 4.4 g/dL (ref 3.5–5.2)
ALK PHOS: 58 U/L (ref 39–117)
ALT: 13 U/L (ref 0–35)
AST: 14 U/L (ref 0–37)
BILIRUBIN TOTAL: 0.3 mg/dL (ref 0.2–1.2)
BUN: 8 mg/dL (ref 6–23)
CO2: 26 mEq/L (ref 19–32)
Calcium: 9.8 mg/dL (ref 8.4–10.5)
Chloride: 103 mEq/L (ref 96–112)
Creatinine, Ser: 0.81 mg/dL (ref 0.40–1.20)
GFR: 83.82 mL/min (ref >60.00)
Glucose, Bld: 98 mg/dL (ref 70–99)
Potassium: 4.3 mEq/L (ref 3.5–5.1)
Sodium: 136 mEq/L (ref 135–145)
Total Protein: 6.9 g/dL (ref 6.0–8.3)

## 2016-08-18 LAB — LIPID PANEL
CHOLESTEROL: 202 mg/dL — AB (ref 0–200)
HDL: 82.8 mg/dL (ref >39.00)
LDL Cholesterol: 112 mg/dL — ABNORMAL HIGH (ref 0–99)
NonHDL: 119.53
Total CHOL/HDL Ratio: 2
Triglycerides: 40 mg/dL (ref 0.0–149.0)
VLDL: 8 mg/dL (ref 0.0–40.0)

## 2016-08-18 LAB — HEMOGLOBIN A1C: HEMOGLOBIN A1C: 5.3 % (ref 4.6–6.5)

## 2016-12-13 ENCOUNTER — Other Ambulatory Visit: Payer: Self-pay | Admitting: Internal Medicine

## 2017-01-11 LAB — HM PAP SMEAR: HM PAP: NEGATIVE

## 2017-01-17 ENCOUNTER — Encounter: Payer: Self-pay | Admitting: Family Medicine

## 2017-01-30 ENCOUNTER — Encounter: Payer: Self-pay | Admitting: Family Medicine

## 2017-01-30 MED ORDER — SERTRALINE HCL 50 MG PO TABS: 75.0000 mg | ORAL_TABLET | Freq: Every day | ORAL | 2 refills | Status: DC

## 2017-05-05 ENCOUNTER — Other Ambulatory Visit: Payer: Self-pay | Admitting: Family Medicine

## 2017-05-29 ENCOUNTER — Other Ambulatory Visit: Payer: Self-pay | Admitting: Family Medicine

## 2017-07-28 ENCOUNTER — Other Ambulatory Visit: Payer: Self-pay | Admitting: Family Medicine

## 2017-08-23 ENCOUNTER — Other Ambulatory Visit: Payer: Self-pay | Admitting: Family Medicine

## 2017-10-11 ENCOUNTER — Encounter: Payer: Self-pay | Admitting: Family Medicine

## 2017-10-11 ENCOUNTER — Ambulatory Visit (INDEPENDENT_AMBULATORY_CARE_PROVIDER_SITE_OTHER): Payer: 59 | Admitting: Family Medicine

## 2017-10-11 VITALS — BP 120/78 | HR 80 | Temp 98.9°F | Ht 66.5 in | Wt 206.8 lb

## 2017-10-11 DIAGNOSIS — Z13220 Encounter for screening for lipoid disorders: Secondary | ICD-10-CM

## 2017-10-11 DIAGNOSIS — R7303 Prediabetes: Secondary | ICD-10-CM | POA: Diagnosis not present

## 2017-10-11 DIAGNOSIS — Z0000 Encounter for general adult medical examination without abnormal findings: Secondary | ICD-10-CM | POA: Diagnosis not present

## 2017-10-11 DIAGNOSIS — F411 Generalized anxiety disorder: Secondary | ICD-10-CM | POA: Diagnosis not present

## 2017-10-11 DIAGNOSIS — L300 Nummular dermatitis: Secondary | ICD-10-CM | POA: Insufficient documentation

## 2017-10-11 LAB — COMPREHENSIVE METABOLIC PANEL
ALBUMIN: 4.1 g/dL (ref 3.5–5.2)
ALK PHOS: 76 U/L (ref 39–117)
ALT: 17 U/L (ref 0–35)
AST: 16 U/L (ref 0–37)
BUN: 11 mg/dL (ref 6–23)
CO2: 28 mEq/L (ref 19–32)
Calcium: 9 mg/dL (ref 8.4–10.5)
Chloride: 103 mEq/L (ref 96–112)
Creatinine, Ser: 0.81 mg/dL (ref 0.40–1.20)
GFR: 83.33 mL/min (ref >60.00)
Glucose, Bld: 108 mg/dL — ABNORMAL HIGH (ref 70–99)
POTASSIUM: 4.7 meq/L (ref 3.5–5.1)
SODIUM: 136 meq/L (ref 135–145)
Total Bilirubin: 0.3 mg/dL (ref 0.2–1.2)
Total Protein: 6.9 g/dL (ref 6.0–8.3)

## 2017-10-11 LAB — LIPID PANEL
CHOLESTEROL: 182 mg/dL (ref 0–200)
HDL: 76.6 mg/dL (ref >39.00)
LDL CALC: 96 mg/dL (ref 0–99)
NonHDL: 105.46
Total CHOL/HDL Ratio: 2
Triglycerides: 48 mg/dL (ref 0.0–149.0)
VLDL: 9.6 mg/dL (ref 0.0–40.0)

## 2017-10-11 LAB — HEMOGLOBIN A1C: Hgb A1c MFr Bld: 5.6 % (ref 4.6–6.5)

## 2017-10-11 NOTE — Progress Notes (Signed)
Subjective:    Patient ID: Teresa Brock, female    DOB: 12/29/1975, 42 y.o.   MRN: [id number]  HPI The patient is here for annual wellness exam and preventative care.    Due for lab re-eval.  GAD: Good control on sertraline 75 mg daily. She is not interested in weaning off at this point. Depression screen [facility] 2/9 10/11/2017 10/11/2017 08/17/2016  Decreased Interest 0 0 0  Down, Depressed, Hopeless 0 0 0  PHQ - 2 Score 0 0 0  Altered sleeping 0 - -  Tired, decreased energy 0 - -  Change in appetite 0 - -  Feeling bad or failure about yourself  0 - -  Trouble concentrating 0 - -  Moving slowly or fidgety/restless 0 - -  Suicidal thoughts 0 - -  PHQ-9 Score 0 - -  Difficult doing work/chores Not difficult at all - -   Diet:  Low carb diet, high protein Exercise: none Body mass index is 32.87 kg/m. Wt Readings from Last 3 Encounters:  10/11/17 206 lb 12 oz (93.8 kg)  08/17/16 181 lb 8 oz (82.3 kg)  04/27/16 174 lb 12 oz (79.3 kg)    Social History /Family History/Past Medical History reviewed in detail and updated in EMR if needed. Blood pressure 120/78, pulse 80, temperature 98.9 F (37.2 C), temperature source Oral, height 5' 6.5" (1.689 m), weight 206 lb 12 oz (93.8 kg), last menstrual period 09/21/2017.  Review of Systems  Constitutional: Negative for fatigue and fever.  HENT: Negative for congestion.   Eyes: Negative for pain.  Respiratory: Negative for cough and shortness of breath.   Cardiovascular: Negative for chest pain, palpitations and leg swelling.  Gastrointestinal: Negative for abdominal pain.  Genitourinary: Negative for dysuria and vaginal bleeding.  Musculoskeletal: Negative for back pain.  Neurological: Negative for syncope, light-headedness and headaches.  Psychiatric/Behavioral: Negative for dysphoric mood.       Objective:   Physical Exam  Constitutional: Vital signs are normal. She appears well-developed and well-nourished. She is  cooperative.  Non-toxic appearance. She does not appear ill. No distress.  HENT:  Head: Normocephalic.  Right Ear: Hearing, tympanic membrane, external ear and ear canal normal.  Left Ear: Hearing, tympanic membrane, external ear and ear canal normal.  Nose: Nose normal.  Eyes: Pupils are equal, round, and reactive to light. Conjunctivae, EOM and lids are normal. Lids are everted and swept, no foreign bodies found.  Neck: Trachea normal and normal range of motion. Neck supple. Carotid bruit is not present. No thyroid mass and no thyromegaly present.  Cardiovascular: Normal rate, regular rhythm, S1 normal, S2 normal, normal heart sounds and intact distal pulses. Exam reveals no gallop.  No murmur heard. Pulmonary/Chest: Effort normal and breath sounds normal. No respiratory distress. She has no wheezes. She has no rhonchi. She has no rales.  Abdominal: Soft. Normal appearance and bowel sounds are normal. She exhibits no distension, no fluid wave, no abdominal bruit and no mass. There is no hepatosplenomegaly. There is no tenderness. There is no rebound, no guarding and no CVA tenderness. No hernia.  Lymphadenopathy:    She has no cervical adenopathy.    She has no axillary adenopathy.  Neurological: She is alert. She has normal strength. No cranial nerve deficit or sensory deficit.  Skin: Skin is warm, dry and intact. Rash noted.  3 small dry patches on arms, not itchy  Psychiatric: Her speech is normal and behavior is normal. Judgment normal. Her mood appears  not anxious. Cognition and memory are normal. She does not exhibit a depressed mood.          Assessment & Plan:  The patient's preventative maintenance and recommended screening tests for an annual wellness exam were reviewed in full today. Brought up to date unless services declined.  Counselled on the importance of diet, exercise, and its role in overall health and mortality. The patient's FH and SH was reviewed, including their  home life, tobacco status, and drug and alcohol status.   Vacccines up to date.Not interested in flu vaccine today. Former smoker Pelvic breast: sees GYN, plan early in year. STD testing: refused. No early colon cancer in family

## 2017-10-11 NOTE — Assessment & Plan Note (Signed)
Topical OTC hydrocortisone BID x 2 weeks.

## 2017-10-11 NOTE — Patient Instructions (Addendum)
Start regular exercise.Marland Kitchen 3-5 times a week.  Keep up with healthy low carb diet.   Please stop at the lab to have labs drawn.

## 2017-10-28 ENCOUNTER — Other Ambulatory Visit: Payer: Self-pay | Admitting: Family Medicine

## 2017-11-22 ENCOUNTER — Other Ambulatory Visit: Payer: Self-pay | Admitting: Family Medicine

## 2018-02-20 ENCOUNTER — Other Ambulatory Visit: Payer: Self-pay | Admitting: Obstetrics and Gynecology

## 2018-02-20 DIAGNOSIS — N6320 Unspecified lump in the left breast, unspecified quadrant: Secondary | ICD-10-CM

## 2018-02-23 HISTORY — PX: BREAST BIOPSY: SHX20

## 2018-03-12 ENCOUNTER — Other Ambulatory Visit: Payer: Self-pay | Admitting: Obstetrics and Gynecology

## 2018-03-12 DIAGNOSIS — R928 Other abnormal and inconclusive findings on diagnostic imaging of breast: Secondary | ICD-10-CM

## 2018-03-15 ENCOUNTER — Ambulatory Visit
Admission: RE | Admit: 2018-03-15 | Discharge: 2018-03-15 | Disposition: A | Discharge status: Home / Self Care | Payer: 59 | Source: Ambulatory Visit | Attending: Obstetrics and Gynecology | Admitting: Obstetrics and Gynecology

## 2018-03-15 ENCOUNTER — Other Ambulatory Visit: Payer: Self-pay | Admitting: Obstetrics and Gynecology

## 2018-03-15 DIAGNOSIS — R928 Other abnormal and inconclusive findings on diagnostic imaging of breast: Secondary | ICD-10-CM

## 2018-03-20 ENCOUNTER — Ambulatory Visit
Admission: RE | Admit: 2018-03-20 | Discharge: 2018-03-20 | Disposition: A | Discharge status: Home / Self Care | Payer: 59 | Source: Ambulatory Visit | Attending: Obstetrics and Gynecology | Admitting: Obstetrics and Gynecology

## 2018-03-20 ENCOUNTER — Other Ambulatory Visit: Payer: Self-pay | Admitting: Obstetrics and Gynecology

## 2018-03-20 DIAGNOSIS — R928 Other abnormal and inconclusive findings on diagnostic imaging of breast: Secondary | ICD-10-CM

## 2018-03-20 DIAGNOSIS — N63 Unspecified lump in unspecified breast: Secondary | ICD-10-CM

## 2018-05-01 ENCOUNTER — Other Ambulatory Visit: Payer: Self-pay | Admitting: Family Medicine

## 2018-05-31 ENCOUNTER — Other Ambulatory Visit: Payer: Self-pay | Admitting: Family Medicine

## 2018-08-24 ENCOUNTER — Other Ambulatory Visit: Payer: Self-pay | Admitting: Family Medicine

## 2018-09-19 ENCOUNTER — Other Ambulatory Visit: Payer: Self-pay | Admitting: Family Medicine

## 2018-09-19 ENCOUNTER — Other Ambulatory Visit: Payer: 59

## 2018-09-22 ENCOUNTER — Other Ambulatory Visit: Payer: Self-pay | Admitting: Family Medicine

## 2018-09-27 ENCOUNTER — Ambulatory Visit: Payer: 59

## 2018-09-27 ENCOUNTER — Ambulatory Visit
Admission: RE | Admit: 2018-09-27 | Discharge: 2018-09-27 | Disposition: A | Discharge status: Home / Self Care | Payer: 59 | Source: Ambulatory Visit | Attending: Obstetrics and Gynecology | Admitting: Obstetrics and Gynecology

## 2018-09-27 ENCOUNTER — Other Ambulatory Visit: Payer: Self-pay | Admitting: Obstetrics and Gynecology

## 2018-09-27 ENCOUNTER — Other Ambulatory Visit: Payer: Self-pay

## 2018-09-27 DIAGNOSIS — N63 Unspecified lump in unspecified breast: Secondary | ICD-10-CM

## 2018-10-18 ENCOUNTER — Other Ambulatory Visit: Payer: Self-pay | Admitting: Family Medicine

## 2018-10-22 ENCOUNTER — Other Ambulatory Visit: Payer: Self-pay

## 2018-10-22 ENCOUNTER — Encounter: Payer: Self-pay | Admitting: Family Medicine

## 2018-10-22 ENCOUNTER — Ambulatory Visit (INDEPENDENT_AMBULATORY_CARE_PROVIDER_SITE_OTHER): Payer: 59 | Admitting: Family Medicine

## 2018-10-22 ENCOUNTER — Encounter: Payer: 59 | Admitting: Family Medicine

## 2018-10-22 VITALS — BP 136/92 | HR 71 | Temp 98.0°F | Ht 66.5 in | Wt 223.2 lb

## 2018-10-22 DIAGNOSIS — Z8349 Family history of other endocrine, nutritional and metabolic diseases: Secondary | ICD-10-CM | POA: Diagnosis not present

## 2018-10-22 DIAGNOSIS — F411 Generalized anxiety disorder: Secondary | ICD-10-CM

## 2018-10-22 DIAGNOSIS — Z0000 Encounter for general adult medical examination without abnormal findings: Secondary | ICD-10-CM | POA: Diagnosis not present

## 2018-10-22 DIAGNOSIS — E669 Obesity, unspecified: Secondary | ICD-10-CM

## 2018-10-22 DIAGNOSIS — R7303 Prediabetes: Secondary | ICD-10-CM | POA: Diagnosis not present

## 2018-10-22 NOTE — Progress Notes (Signed)
Chief Complaint  Patient presents with  . Annual Exam    History of Present Illness: HPI  The patient is here for annual wellness exam and preventative care.      GAD:   Doing well on  sertraline 50 mg daily.  Stress reduction techniques. Sleeping well 6-7 hours night.  Prediabetes  Due for re-eval.  She has gained weight since pandemic.  She has started cycling 30 min every other day.  Wt Readings from Last 3 Encounters:  10/22/18 223 lb 4 oz (101.3 kg)  10/11/17 206 lb 12 oz (93.8 kg)  08/17/16 181 lb 8 oz (82.3 kg)  Working on [employer]. Lean foods, low carb.  COVID 19 screen No recent travel or known exposure to COVID19 The patient denies respiratory symptoms of COVID 19 at this time.  The importance of social distancing was discussed today.   Review of Systems  Constitutional: Negative for chills and fever.  HENT: Negative for congestion and ear pain.   Eyes: Negative for pain and redness.  Respiratory: Negative for cough and shortness of breath.   Cardiovascular: Negative for chest pain, palpitations and leg swelling.  Gastrointestinal: Negative for abdominal pain, blood in stool, constipation, diarrhea, nausea and vomiting.  Genitourinary: Negative for dysuria.  Musculoskeletal: Negative for falls and myalgias.  Skin: Negative for rash.  Neurological: Negative for dizziness.  Psychiatric/Behavioral: Negative for depression. The patient is not nervous/anxious.       Past Medical History:  Diagnosis Date  . 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 GERD (gastroesophageal reflux disease)   . Hydrosalpinx    bilateral  . Wears contact lenses     reports that she quit smoking about 14 years ago. Her smoking use included cigarettes. She has a 2.50 pack-year smoking history. She has never used smokeless tobacco. She reports current alcohol use. She reports that she does not use drugs.   Current Outpatient Medications:  .  b complex vitamins tablet, Take 1  tablet by mouth daily., Disp: , Rfl:  .  famotidine (PEPCID AC) 10 MG chewable tablet, Chew 10 mg by mouth as needed for heartburn., Disp: , Rfl:  .  fexofenadine (ALLEGRA) 60 MG tablet, Take 60 mg by mouth daily as needed for allergies or rhinitis., Disp: , Rfl:  .  Multiple Vitamin (MULTIVITAMIN) tablet, Take 1 tablet by mouth daily., Disp: , Rfl:  .  sertraline (ZOLOFT) 50 MG tablet, Take 50 mg by mouth daily., Disp: , Rfl:  .  vitamin E 400 UNIT capsule, Take 400 Units by mouth daily., Disp: , Rfl:    Observations/Objective: Blood pressure (!) 136/92, pulse 71, temperature 98 F (36.7 C), temperature source Temporal, height 5' 6.5" (1.689 m), weight 223 lb 4 oz (101.3 kg), last menstrual period 10/14/2018, SpO2 97 %.  Physical Exam Constitutional:      General: She is not in acute distress.    Appearance: Normal appearance. She is well-developed. She is not ill-appearing or toxic-appearing.  HENT:     Head: Normocephalic.     Right Ear: Hearing, tympanic membrane, ear canal and external ear normal.     Left Ear: Hearing, tympanic membrane, ear canal and external ear normal.     Nose: Nose normal.  Eyes:     General: Lids are normal. Lids are everted, no foreign bodies appreciated.     Conjunctiva/sclera: Conjunctivae normal.     Pupils: Pupils are equal, round, and reactive to light.  Neck:     Musculoskeletal:  Normal range of motion and neck supple.     Thyroid: No thyroid mass or thyromegaly.     Vascular: No carotid bruit.     Trachea: Trachea normal.  Cardiovascular:     Rate and Rhythm: Normal rate and regular rhythm.     Heart sounds: Normal heart sounds, S1 normal and S2 normal. No murmur. No gallop.   Pulmonary:     Effort: Pulmonary effort is normal. No respiratory distress.     Breath sounds: Normal breath sounds. No wheezing, rhonchi or rales.  Abdominal:     General: Bowel sounds are normal. There is no distension or abdominal bruit.     Palpations: Abdomen is  soft. There is no fluid wave or mass.     Tenderness: There is no abdominal tenderness. There is no guarding or rebound.     Hernia: No hernia is present.  Lymphadenopathy:     Cervical: No cervical adenopathy.  Skin:    General: Skin is warm and dry.     Findings: No rash.  Neurological:     Mental Status: She is alert.     Cranial Nerves: No cranial nerve deficit.     Sensory: No sensory deficit.  Psychiatric:        Mood and Affect: Mood is not anxious or depressed.        Speech: Speech normal.        Behavior: Behavior normal. Behavior is cooperative.        Judgment: Judgment normal.      Assessment and Plan   The patient's preventative maintenance and recommended screening tests for an annual wellness exam were reviewed in full today. Brought up to date unless services declined.  Counselled on the importance of diet, exercise, and its role in overall health and mortality. The patient's FH and SH was reviewed, including their home life, tobacco status, and drug and alcohol status.   Vacccines up to date. Not interested in flu vaccine today. Formersmoker, remote Pelvic breast: sees GYN, breast bx 2019.. nonmalignant, 6 month follow up  Negative. STD testing: refused. No early colon cancer in family     Eliezer Lofts, MD

## 2018-10-22 NOTE — Patient Instructions (Signed)
Follow BP at home.Marland Kitchen goal < 140/90 at rest.  Continue working on healthy eating and regular  Exercise.

## 2018-10-23 LAB — COMPREHENSIVE METABOLIC PANEL
ALT: 23 U/L (ref 0–35)
AST: 19 U/L (ref 0–37)
Albumin: 4.3 g/dL (ref 3.5–5.2)
Alkaline Phosphatase: 85 U/L (ref 39–117)
BUN: 9 mg/dL (ref 6–23)
CO2: 27 mEq/L (ref 19–32)
Calcium: 9.5 mg/dL (ref 8.4–10.5)
Chloride: 103 mEq/L (ref 96–112)
Creatinine, Ser: 0.89 mg/dL (ref 0.40–1.20)
GFR: 69.96 mL/min (ref >60.00)
Glucose, Bld: 105 mg/dL — ABNORMAL HIGH (ref 70–99)
Potassium: 4.7 mEq/L (ref 3.5–5.1)
Sodium: 138 mEq/L (ref 135–145)
Total Bilirubin: 0.4 mg/dL (ref 0.2–1.2)
Total Protein: 6.8 g/dL (ref 6.0–8.3)

## 2018-10-23 LAB — TSH: TSH: 1.3 u[IU]/mL (ref 0.35–4.50)

## 2018-10-23 LAB — LIPID PANEL
Cholesterol: 200 mg/dL (ref 0–200)
HDL: 70.3 mg/dL (ref >39.00)
LDL Cholesterol: 112 mg/dL — ABNORMAL HIGH (ref 0–99)
NonHDL: 130.01
Total CHOL/HDL Ratio: 3
Triglycerides: 88 mg/dL (ref 0.0–149.0)
VLDL: 17.6 mg/dL (ref 0.0–40.0)

## 2018-10-23 LAB — T4, FREE: Free T4: 0.77 ng/dL (ref 0.60–1.60)

## 2018-10-23 LAB — T3, FREE: T3, Free: 2.9 pg/mL (ref 2.3–4.2)

## 2018-10-23 LAB — HEMOGLOBIN A1C: Hgb A1c MFr Bld: 5.6 % (ref 4.6–6.5)

## 2019-01-09 ENCOUNTER — Telehealth: Payer: Self-pay | Admitting: Family Medicine

## 2019-01-09 NOTE — Telephone Encounter (Signed)
Left message asking pt to call office  °

## 2019-01-09 NOTE — Telephone Encounter (Signed)
Please schedule appointment.  Patient was to return around 01/21/2019 to follow up weight management.

## 2019-01-10 NOTE — Telephone Encounter (Signed)
Left message asking pt to call office  °

## 2019-01-10 NOTE — Telephone Encounter (Signed)
Appointment 1/26 Pt wanted to wait till after holidays

## 2019-02-04 ENCOUNTER — Other Ambulatory Visit: Payer: Self-pay | Admitting: Family Medicine

## 2019-02-18 ENCOUNTER — Ambulatory Visit: Payer: 59 | Admitting: Family Medicine

## 2019-06-10 ENCOUNTER — Other Ambulatory Visit: Payer: Self-pay | Admitting: *Deleted

## 2019-06-10 MED ORDER — SERTRALINE HCL 50 MG PO TABS: ORAL_TABLET | ORAL | 0 refills | Status: DC

## 2019-09-26 ENCOUNTER — Telehealth: Payer: Self-pay | Admitting: Family Medicine

## 2019-09-26 ENCOUNTER — Other Ambulatory Visit: Payer: 59

## 2019-09-26 DIAGNOSIS — R7303 Prediabetes: Secondary | ICD-10-CM

## 2019-09-26 DIAGNOSIS — Z1159 Encounter for screening for other viral diseases: Secondary | ICD-10-CM

## 2019-09-26 NOTE — Telephone Encounter (Signed)
-----   Message from Cloyd Stagers, RT sent at 09/08/2019 10:50 AM EDT ----- Regarding: Lab Orders for Friday 9.3.2021 Please place lab orders for  Friday 9.3.2021, office visit for physical on Tuesday 9.14.2021 Thank you, Dyke Maes RT(R)

## 2019-10-07 ENCOUNTER — Encounter: Payer: 59 | Admitting: Family Medicine

## 2019-12-17 ENCOUNTER — Other Ambulatory Visit: Payer: Self-pay | Admitting: *Deleted

## 2019-12-17 MED ORDER — SERTRALINE HCL 50 MG PO TABS: ORAL_TABLET | ORAL | 0 refills | Status: DC

## 2020-02-20 ENCOUNTER — Ambulatory Visit: Payer: 59 | Admitting: Family Medicine

## 2020-03-04 ENCOUNTER — Other Ambulatory Visit: Payer: Self-pay

## 2020-03-05 ENCOUNTER — Encounter: Payer: Self-pay | Admitting: Family Medicine

## 2020-03-05 ENCOUNTER — Ambulatory Visit (INDEPENDENT_AMBULATORY_CARE_PROVIDER_SITE_OTHER): Payer: 59 | Admitting: Family Medicine

## 2020-03-05 VITALS — BP 124/90 | HR 74 | Temp 98.0°F | Ht 66.75 in | Wt 226.2 lb

## 2020-03-05 DIAGNOSIS — F411 Generalized anxiety disorder: Secondary | ICD-10-CM | POA: Diagnosis not present

## 2020-03-05 NOTE — Progress Notes (Signed)
Patient ID: Teresa Brock, female    DOB: Jul 10, 1975, 45 y.o.   MRN: [id number]  This visit was conducted in person.  BP 124/90   Pulse 74   Temp 98 F (36.7 C) (Temporal)   Ht 5' 6.75" (1.695 m)   Wt 226 lb 4 oz (102.6 kg)   LMP 02/24/2020   SpO2 98%   BMI 35.70 kg/m    CC:  Chief Complaint  Patient presents with  . Medication Refill    Zoloft    Subjective:   HPI: Teresa Brock is a 45 y.o. female presenting on 03/05/2020 for Medication Refill (Zoloft)  She is using sertraline 50 mg daily.Marland Kitchen occ use extra 1/2 tablet.  No SE. She has been on it for 2 years.    GAD well controlled on this medication. No insomnia,Decnies SI.  She is ready to consider decreasing the medication.  She has matured and grown with her issues and is managing better overall.  GAD7: 6  PHQ7: 8   Relevant past medical, surgical, family and social history reviewed and updated as indicated. Interim medical history since our last visit reviewed. Allergies and medications reviewed and updated. Outpatient Medications Prior to Visit  Medication Sig Dispense Refill  . b complex vitamins tablet Take 1 tablet by mouth daily.    . famotidine (PEPCID AC) 10 MG chewable tablet Chew 10 mg by mouth as needed for heartburn.    . fexofenadine (ALLEGRA) 60 MG tablet Take 60 mg by mouth daily as needed for allergies or rhinitis.    . Multiple Vitamin (MULTIVITAMIN) tablet Take 1 tablet by mouth daily.    . sertraline (ZOLOFT) 50 MG tablet TAKE 1&1/2 TABLETS (75 MG TOTAL) BY MOUTH AT BEDTIME. (Patient taking differently: 50 mg. TAKE 1&1/2 TABLETS (75 MG TOTAL) BY MOUTH AT BEDTIME.) 45 tablet 0  . vitamin E 400 UNIT capsule Take 400 Units by mouth daily.     No facility-administered medications prior to visit.     Per HPI unless specifically indicated in ROS section below Review of Systems  Constitutional: Negative for fatigue and fever.  HENT: Negative for ear pain.   Eyes: Negative for pain.   Respiratory: Negative for chest tightness and shortness of breath.   Cardiovascular: Negative for chest pain, palpitations and leg swelling.  Gastrointestinal: Negative for abdominal pain.  Genitourinary: Negative for dysuria.   Objective:  BP 124/90   Pulse 74   Temp 98 F (36.7 C) (Temporal)   Ht 5' 6.75" (1.695 m)   Wt 226 lb 4 oz (102.6 kg)   LMP 02/24/2020   SpO2 98%   BMI 35.70 kg/m   Wt Readings from Last 3 Encounters:  03/05/20 226 lb 4 oz (102.6 kg)  10/22/18 223 lb 4 oz (101.3 kg)  10/11/17 206 lb 12 oz (93.8 kg)      Physical Exam Constitutional:      General: She is not in acute distress.Vital signs are normal.     Appearance: Normal appearance. She is well-developed and well-nourished. She is not ill-appearing or toxic-appearing.  HENT:     Head: Normocephalic.     Right Ear: Hearing, tympanic membrane, ear canal and external ear normal. Tympanic membrane is not erythematous, retracted or bulging.     Left Ear: Hearing, tympanic membrane, ear canal and external ear normal. Tympanic membrane is not erythematous, retracted or bulging.     Nose: No mucosal edema or rhinorrhea.     Right Sinus:  No maxillary sinus tenderness or frontal sinus tenderness.     Left Sinus: No maxillary sinus tenderness or frontal sinus tenderness.     Mouth/Throat:     Mouth: Oropharynx is clear and moist and mucous membranes are normal.     Pharynx: Uvula midline.  Eyes:     General: Lids are normal. Lids are everted, no foreign bodies appreciated.     Extraocular Movements: EOM normal.     Conjunctiva/sclera: Conjunctivae normal.     Pupils: Pupils are equal, round, and reactive to light.  Neck:     Thyroid: No thyroid mass or thyromegaly.     Vascular: No carotid bruit.     Trachea: Trachea normal.  Cardiovascular:     Rate and Rhythm: Normal rate and regular rhythm.     Pulses: Normal pulses and intact distal pulses.     Heart sounds: Normal heart sounds, S1 normal and S2  normal. No murmur heard. No friction rub. No gallop.   Pulmonary:     Effort: Pulmonary effort is normal. No tachypnea or respiratory distress.     Breath sounds: Normal breath sounds. No decreased breath sounds, wheezing, rhonchi or rales.  Abdominal:     General: Bowel sounds are normal.     Palpations: Abdomen is soft.     Tenderness: There is no abdominal tenderness.  Musculoskeletal:     Cervical back: Normal range of motion and neck supple.  Skin:    General: Skin is warm, dry and intact.     Findings: No rash.  Neurological:     Mental Status: She is alert.  Psychiatric:        Mood and Affect: Mood is not anxious or depressed.        Speech: Speech normal.        Behavior: Behavior normal. Behavior is cooperative.        Thought Content: Thought content normal.        Cognition and Memory: Cognition and memory normal.        Judgment: Judgment normal.       Results for orders placed or performed in visit on 10/22/18  Hemoglobin A1c  Result Value Ref Range   Hgb A1c MFr Bld 5.6 4.6 - 6.5 %  Lipid panel  Result Value Ref Range   Cholesterol 200 0 - 200 mg/dL   Triglycerides 88.0 0.0 - 149.0 mg/dL   HDL 70.30 >39.00 mg/dL   VLDL 17.6 0.0 - 40.0 mg/dL   LDL Cholesterol 112 (H) 0 - 99 mg/dL   Total CHOL/HDL Ratio 3    NonHDL 130.01   Comprehensive metabolic panel  Result Value Ref Range   Sodium 138 135 - 145 mEq/L   Potassium 4.7 3.5 - 5.1 mEq/L   Chloride 103 96 - 112 mEq/L   CO2 27 19 - 32 mEq/L   Glucose, Bld 105 (H) 70 - 99 mg/dL   BUN 9 6 - 23 mg/dL   Creatinine, Ser 0.89 0.40 - 1.20 mg/dL   Total Bilirubin 0.4 0.2 - 1.2 mg/dL   Alkaline Phosphatase 85 39 - 117 U/L   AST 19 0 - 37 U/L   ALT 23 0 - 35 U/L   Total Protein 6.8 6.0 - 8.3 g/dL   Albumin 4.3 3.5 - 5.2 g/dL   Calcium 9.5 8.4 - 10.5 mg/dL   GFR 69.96 >60.00 mL/min  T4, free  Result Value Ref Range   Free T4 0.77 0.60 - 1.60 ng/dL  T3, free  Result Value Ref Range   T3, Free 2.9 2.3 - 4.2  pg/mL  TSH  Result Value Ref Range   TSH 1.30 0.35 - 4.50 uIU/mL    This visit occurred during the SARS-CoV-2 public health emergency.  Safety protocols were in place, including screening questions prior to the visit, additional usage of staff PPE, and extensive cleaning of exam room while observing appropriate contact time as indicated for disinfecting solutions.   COVID 19 screen:  No recent travel or known exposure to COVID19 The patient denies respiratory symptoms of COVID 19 at this time. The importance of social distancing was discussed today.   Assessment and Plan     Eliezer Lofts, MD

## 2020-03-05 NOTE — Patient Instructions (Addendum)
Work on decreasing sertraline as able. Consider counseling as discuss.. call or message if interested.

## 2020-03-06 MED ORDER — SERTRALINE HCL 50 MG PO TABS: 50.0000 mg | ORAL_TABLET | Freq: Every day | ORAL | 5 refills | Status: DC

## 2020-03-06 NOTE — Assessment & Plan Note (Signed)
Stable, chronic.  Continue current medication.  Meds ordered this encounter  Medications  . sertraline (ZOLOFT) 50 MG tablet    Sig: Take 1 tablet (50 mg total) by mouth at bedtime. TAKE 1&1/2 TABLETS (75 MG TOTAL) BY MOUTH AT BEDTIME.    Dispense:  45 tablet    Refill:  5    NEEDS OFFICE VISIT PRIOR TO ANY ADDITIONAL REFILLS.

## 2020-03-18 ENCOUNTER — Other Ambulatory Visit: Payer: Self-pay | Admitting: Obstetrics and Gynecology

## 2020-03-18 DIAGNOSIS — R928 Other abnormal and inconclusive findings on diagnostic imaging of breast: Secondary | ICD-10-CM

## 2020-03-22 ENCOUNTER — Telehealth: Payer: Self-pay | Admitting: *Deleted

## 2020-03-22 MED ORDER — SERTRALINE HCL 50 MG PO TABS: 75.0000 mg | ORAL_TABLET | Freq: Every day | ORAL | 0 refills | Status: DC

## 2020-03-22 NOTE — Telephone Encounter (Signed)
I sent in 90 day supply to [facility].  Patient is suppose to return in about 3 months (around 06/02/2020) for CPX wth labs prior.

## 2020-03-22 NOTE — Telephone Encounter (Signed)
Teresa Brock with [facility] left a voicemail stating that they received a script for Sertraline 50 mg, take 1 and 1/2 daily. Teresa Brock stated that patient is requesting a 90 day supply and they need Dr. Rometta Emery approval on that.

## 2020-04-01 ENCOUNTER — Other Ambulatory Visit: Payer: Self-pay

## 2020-04-01 ENCOUNTER — Ambulatory Visit
Admission: RE | Admit: 2020-04-01 | Discharge: 2020-04-01 | Disposition: A | Discharge status: Home / Self Care | Payer: 59 | Source: Ambulatory Visit | Attending: Obstetrics and Gynecology | Admitting: Obstetrics and Gynecology

## 2020-04-01 DIAGNOSIS — R928 Other abnormal and inconclusive findings on diagnostic imaging of breast: Secondary | ICD-10-CM

## 2020-04-01 LAB — HM MAMMOGRAPHY

## 2020-06-28 ENCOUNTER — Other Ambulatory Visit: Payer: Self-pay | Admitting: Family Medicine

## 2020-06-28 ENCOUNTER — Telehealth: Payer: Self-pay | Admitting: Family Medicine

## 2020-06-28 DIAGNOSIS — R7303 Prediabetes: Secondary | ICD-10-CM

## 2020-06-28 NOTE — Telephone Encounter (Signed)
-----   Message from Cloyd Stagers, RT sent at 06/15/2020  1:15 PM EDT ----- Regarding: Lab Orders for Tuesday 6.7.2022 Please place lab orders for Tuesday 6.7.2022, office visit for physical on Tuesday 6.14.2022 Thank you, Dyke Maes RT(R)

## 2020-06-29 ENCOUNTER — Other Ambulatory Visit: Payer: Self-pay

## 2020-06-29 ENCOUNTER — Other Ambulatory Visit (INDEPENDENT_AMBULATORY_CARE_PROVIDER_SITE_OTHER): Payer: 59

## 2020-06-29 DIAGNOSIS — R7303 Prediabetes: Secondary | ICD-10-CM | POA: Diagnosis not present

## 2020-06-29 DIAGNOSIS — Z1159 Encounter for screening for other viral diseases: Secondary | ICD-10-CM

## 2020-06-29 LAB — COMPREHENSIVE METABOLIC PANEL
ALT: 21 U/L (ref 0–35)
AST: 18 U/L (ref 0–37)
Albumin: 4.1 g/dL (ref 3.5–5.2)
Alkaline Phosphatase: 81 U/L (ref 39–117)
BUN: 16 mg/dL (ref 6–23)
CO2: 24 mEq/L (ref 19–32)
Calcium: 8.9 mg/dL (ref 8.4–10.5)
Chloride: 102 mEq/L (ref 96–112)
Creatinine, Ser: 0.87 mg/dL (ref 0.40–1.20)
GFR: 82.13 mL/min (ref >60.00)
Glucose, Bld: 109 mg/dL — ABNORMAL HIGH (ref 70–99)
Potassium: 4.3 mEq/L (ref 3.5–5.1)
Sodium: 135 mEq/L (ref 135–145)
Total Bilirubin: 0.4 mg/dL (ref 0.2–1.2)
Total Protein: 6.7 g/dL (ref 6.0–8.3)

## 2020-06-29 LAB — LIPID PANEL
Cholesterol: 204 mg/dL — ABNORMAL HIGH (ref 0–200)
HDL: 63.2 mg/dL (ref >39.00)
LDL Cholesterol: 125 mg/dL — ABNORMAL HIGH (ref 0–99)
NonHDL: 140.7
Total CHOL/HDL Ratio: 3
Triglycerides: 79 mg/dL (ref 0.0–149.0)
VLDL: 15.8 mg/dL (ref 0.0–40.0)

## 2020-06-29 LAB — HEMOGLOBIN A1C: Hgb A1c MFr Bld: 5.8 % (ref 4.6–6.5)

## 2020-06-29 NOTE — Progress Notes (Signed)
No critical labs need to be addressed urgently. We will discuss labs in detail at upcoming office visit.   

## 2020-06-30 LAB — HEPATITIS C ANTIBODY
Hepatitis C Ab: NONREACTIVE
SIGNAL TO CUT-OFF: 0 (ref <1.00)

## 2020-06-30 NOTE — Progress Notes (Signed)
No critical labs need to be addressed urgently. We will discuss labs in detail at upcoming office visit.   

## 2020-07-06 ENCOUNTER — Ambulatory Visit (INDEPENDENT_AMBULATORY_CARE_PROVIDER_SITE_OTHER): Payer: 59 | Admitting: Family Medicine

## 2020-07-06 ENCOUNTER — Other Ambulatory Visit: Payer: Self-pay

## 2020-07-06 VITALS — BP 126/90 | HR 71 | Temp 98.3°F | Ht 66.75 in | Wt 224.5 lb

## 2020-07-06 DIAGNOSIS — R7303 Prediabetes: Secondary | ICD-10-CM | POA: Diagnosis not present

## 2020-07-06 DIAGNOSIS — Z0000 Encounter for general adult medical examination without abnormal findings: Secondary | ICD-10-CM | POA: Diagnosis not present

## 2020-07-06 DIAGNOSIS — F411 Generalized anxiety disorder: Secondary | ICD-10-CM

## 2020-07-06 MED ORDER — SERTRALINE HCL 50 MG PO TABS: ORAL_TABLET | ORAL | 1 refills | Status: DC

## 2020-07-06 NOTE — Assessment & Plan Note (Signed)
Stable, chronic.  Continue current medication.   Sertraline 75 mg daily 

## 2020-07-06 NOTE — Progress Notes (Signed)
Patient ID: Teresa Brock, female    DOB: 1975/04/03, 45 y.o.   MRN: [id number]  This visit was conducted in person.  Temp 98.3 F (36.8 C) (Temporal)   Ht 5' 6.75" (1.695 m)   Wt 224 lb 8 oz (101.8 kg)   LMP 07/03/2020   BMI 35.43 kg/m    CC:  Chief Complaint  Patient presents with   Annual Exam    Subjective:   HPI: Teresa Brock is a 45 y.o. female presenting on 07/06/2020 for Annual Exam  Reviewed labs in detail with patient.  Diet:  moderate 2-3 eggs per day Exercise: Jogging 3 times a week.  She has decreased pants size   Cardiac CT score was 0 .Marland KitchenMarland Kitchenreviewed records from April.  GAD  stable control on sertraline 50 mg daily. Occ incrasing to 1.5 mg tabs daily.  [place] Office Visit from 03/05/2020 in [place] at [facility] Total Score 4      Wt Readings from Last 3 Encounters:  07/06/20 224 lb 8 oz (101.8 kg)  03/05/20 226 lb 4 oz (102.6 kg)  10/22/18 223 lb 4 oz (101.3 kg)   Body mass index is 35.43 kg/m.  BP Readings from Last 3 Encounters:  07/06/20 126/90  03/05/20 124/90  10/22/18 (!) 136/92   Blood pressure  [phone]        Relevant past medical, surgical, family and social history reviewed and updated as indicated. Interim medical history since our last visit reviewed. Allergies and medications reviewed and updated. Outpatient Medications Prior to Visit  Medication Sig Dispense Refill   b complex vitamins tablet Take 1 tablet by mouth daily.     famotidine (PEPCID AC) 10 MG chewable tablet Chew 10 mg by mouth as needed for heartburn.     fexofenadine (ALLEGRA) 60 MG tablet Take 60 mg by mouth daily as needed for allergies or rhinitis.     Multiple Vitamin (MULTIVITAMIN) tablet Take 1 tablet by mouth daily.     sertraline (ZOLOFT) 50 MG tablet TAKE ONE AND ONE-HALF TABLETS BY MOUTH ONE TIME DAILY AT BEDTIME 135 tablet 0   vitamin E 400 UNIT capsule Take 400 Units by mouth daily.     No  facility-administered medications prior to visit.     Per HPI unless specifically indicated in ROS section below Review of Systems  Constitutional:  Negative for fatigue and fever.  HENT:  Negative for congestion.   Eyes:  Negative for pain.  Respiratory:  Negative for cough and shortness of breath.   Cardiovascular:  Negative for chest pain, palpitations and leg swelling.  Gastrointestinal:  Negative for abdominal pain.  Genitourinary:  Negative for dysuria and vaginal bleeding.  Musculoskeletal:  Negative for back pain.  Neurological:  Negative for syncope, light-headedness and headaches.  Psychiatric/Behavioral:  Negative for dysphoric mood.   Objective:  Temp 98.3 F (36.8 C) (Temporal)   Ht 5' 6.75" (1.695 m)   Wt 224 lb 8 oz (101.8 kg)   LMP 07/03/2020   BMI 35.43 kg/m   Wt Readings from Last 3 Encounters:  07/06/20 224 lb 8 oz (101.8 kg)  03/05/20 226 lb 4 oz (102.6 kg)  10/22/18 223 lb 4 oz (101.3 kg)      Physical Exam Constitutional:      General: She is not in acute distress.    Appearance: Normal appearance. She is well-developed. She is not ill-appearing or toxic-appearing.  HENT:     Head: Normocephalic.  Right Ear: Hearing, tympanic membrane, ear canal and external ear normal.     Left Ear: Hearing, tympanic membrane, ear canal and external ear normal.     Nose: Nose normal.  Eyes:     General: Lids are normal. Lids are everted, no foreign bodies appreciated.     Conjunctiva/sclera: Conjunctivae normal.     Pupils: Pupils are equal, round, and reactive to light.  Neck:     Thyroid: No thyroid mass or thyromegaly.     Vascular: No carotid bruit.     Trachea: Trachea normal.  Cardiovascular:     Rate and Rhythm: Normal rate and regular rhythm.     Heart sounds: Normal heart sounds, S1 normal and S2 normal. No murmur heard.   No gallop.  Pulmonary:     Effort: Pulmonary effort is normal. No respiratory distress.     Breath sounds: Normal breath  sounds. No wheezing, rhonchi or rales.  Abdominal:     General: Bowel sounds are normal. There is no distension or abdominal bruit.     Palpations: Abdomen is soft. There is no fluid wave or mass.     Tenderness: There is no abdominal tenderness. There is no guarding or rebound.     Hernia: No hernia is present.  Musculoskeletal:     Cervical back: Normal range of motion and neck supple.  Lymphadenopathy:     Cervical: No cervical adenopathy.  Skin:    General: Skin is warm and dry.     Findings: No rash.  Neurological:     Mental Status: She is alert.     Cranial Nerves: No cranial nerve deficit.     Sensory: No sensory deficit.  Psychiatric:        Mood and Affect: Mood is not anxious or depressed.        Speech: Speech normal.        Behavior: Behavior normal. Behavior is cooperative.        Judgment: Judgment normal.      Results for orders placed or performed in visit on 06/29/20  Comprehensive metabolic panel  Result Value Ref Range   Sodium 135 135 - 145 mEq/L   Potassium 4.3 3.5 - 5.1 mEq/L   Chloride 102 96 - 112 mEq/L   CO2 24 19 - 32 mEq/L   Glucose, Bld 109 (H) 70 - 99 mg/dL   BUN 16 6 - 23 mg/dL   Creatinine, Ser 0.87 0.40 - 1.20 mg/dL   Total Bilirubin 0.4 0.2 - 1.2 mg/dL   Alkaline Phosphatase 81 39 - 117 U/L   AST 18 0 - 37 U/L   ALT 21 0 - 35 U/L   Total Protein 6.7 6.0 - 8.3 g/dL   Albumin 4.1 3.5 - 5.2 g/dL   GFR 82.13 >60.00 mL/min   Calcium 8.9 8.4 - 10.5 mg/dL  Lipid panel  Result Value Ref Range   Cholesterol 204 (H) 0 - 200 mg/dL   Triglycerides 79.0 0.0 - 149.0 mg/dL   HDL 63.20 >39.00 mg/dL   VLDL 15.8 0.0 - 40.0 mg/dL   LDL Cholesterol 125 (H) 0 - 99 mg/dL   Total CHOL/HDL Ratio 3    NonHDL 140.70   Hemoglobin A1c  Result Value Ref Range   Hgb A1c MFr Bld 5.8 4.6 - 6.5 %  Hepatitis C antibody  Result Value Ref Range   Hepatitis C Ab NON-REACTIVE NON-REACTIVE   SIGNAL TO CUT-OFF 0.00 <1.00    This visit occurred during  the  SARS-CoV-2 public health emergency.  Safety protocols were in place, including screening questions prior to the visit, additional usage of staff PPE, and extensive cleaning of exam room while observing appropriate contact time as indicated for disinfecting solutions.   COVID 19 screen:  No recent travel or known exposure to COVID19 The patient denies respiratory symptoms of COVID 19 at this time. The importance of social distancing was discussed today.   Assessment and Plan   The patient's preventative maintenance and recommended screening tests for an annual wellness exam were reviewed in full today. Brought up to date unless services declined.  Counselled on the importance of diet, exercise, and its role in overall health and mortality. The patient's FH and SH was reviewed, including their home life, tobacco status, and drug and alcohol status.   Vacccines up to date.  Former smoker, remote Pelvic, breast: sees GYN, breast bx 2019.. nonmalignant, 6 month follow up  Negative. Mammogram [facility]: followed. STD testing: refused. No early colon cancer in family  Hep C negative  Problem List Items Addressed This Visit     GAD (generalized anxiety disorder)    Stable, chronic.  Continue current medication.   Sertraline 75 mg daily       Relevant Medications   sertraline (ZOLOFT) 50 MG tablet   Prediabetes    Encouraged exercise, weight loss, healthy eating habits.        Other Visit Diagnoses     Routine general medical examination at a health care facility    -  Primary        Eliezer Lofts, MD

## 2020-07-06 NOTE — Assessment & Plan Note (Signed)
Encouraged exercise, weight loss, healthy eating habits. ? ?

## 2021-03-01 ENCOUNTER — Telehealth (HOSPITAL_BASED_OUTPATIENT_CLINIC_OR_DEPARTMENT_OTHER): Payer: Self-pay

## 2021-03-01 ENCOUNTER — Other Ambulatory Visit (HOSPITAL_BASED_OUTPATIENT_CLINIC_OR_DEPARTMENT_OTHER): Payer: Self-pay | Admitting: Obstetrics and Gynecology

## 2021-03-01 DIAGNOSIS — Z1231 Encounter for screening mammogram for malignant neoplasm of breast: Secondary | ICD-10-CM

## 2021-03-04 ENCOUNTER — Ambulatory Visit (HOSPITAL_BASED_OUTPATIENT_CLINIC_OR_DEPARTMENT_OTHER)
Admission: RE | Admit: 2021-03-04 | Discharge: 2021-03-04 | Disposition: A | Discharge status: Home / Self Care | Payer: 59 | Source: Ambulatory Visit | Attending: Obstetrics and Gynecology | Admitting: Obstetrics and Gynecology

## 2021-03-04 ENCOUNTER — Encounter (HOSPITAL_BASED_OUTPATIENT_CLINIC_OR_DEPARTMENT_OTHER): Payer: Self-pay

## 2021-03-04 ENCOUNTER — Other Ambulatory Visit: Payer: Self-pay

## 2021-03-04 DIAGNOSIS — Z1231 Encounter for screening mammogram for malignant neoplasm of breast: Secondary | ICD-10-CM | POA: Diagnosis not present

## 2021-04-09 ENCOUNTER — Other Ambulatory Visit: Payer: Self-pay | Admitting: Family Medicine

## 2021-08-15 ENCOUNTER — Telehealth: Payer: Self-pay | Admitting: Family Medicine

## 2021-08-15 DIAGNOSIS — R7303 Prediabetes: Secondary | ICD-10-CM

## 2021-08-15 NOTE — Telephone Encounter (Signed)
-----   Message from Ellamae Sia sent at 08/05/2021  3:43 PM EDT ----- Regarding: Lab orders for Tuesday, 7.25.23 Patient is scheduled for CPX labs, please order future labs, Thanks , Karna Christmas

## 2021-08-16 ENCOUNTER — Encounter: Payer: Self-pay | Admitting: Family Medicine

## 2021-08-16 ENCOUNTER — Other Ambulatory Visit (INDEPENDENT_AMBULATORY_CARE_PROVIDER_SITE_OTHER): Payer: 59

## 2021-08-16 DIAGNOSIS — R7303 Prediabetes: Secondary | ICD-10-CM

## 2021-08-16 LAB — COMPREHENSIVE METABOLIC PANEL
ALT: 21 U/L (ref 0–35)
AST: 19 U/L (ref 0–37)
Albumin: 4.5 g/dL (ref 3.5–5.2)
Alkaline Phosphatase: 85 U/L (ref 39–117)
BUN: 15 mg/dL (ref 6–23)
CO2: 25 mEq/L (ref 19–32)
Calcium: 8.9 mg/dL (ref 8.4–10.5)
Chloride: 103 mEq/L (ref 96–112)
Creatinine, Ser: 0.83 mg/dL (ref 0.40–1.20)
GFR: 86.22 mL/min (ref >60.00)
Glucose, Bld: 99 mg/dL (ref 70–99)
Potassium: 3.8 mEq/L (ref 3.5–5.1)
Sodium: 137 mEq/L (ref 135–145)
Total Bilirubin: 0.2 mg/dL (ref 0.2–1.2)
Total Protein: 7.1 g/dL (ref 6.0–8.3)

## 2021-08-16 LAB — LIPID PANEL
Cholesterol: 239 mg/dL — ABNORMAL HIGH (ref 0–200)
HDL: 58.8 mg/dL (ref >39.00)
NonHDL: 179.93
Total CHOL/HDL Ratio: 4
Triglycerides: 358 mg/dL — ABNORMAL HIGH (ref 0.0–149.0)
VLDL: 71.6 mg/dL — ABNORMAL HIGH (ref 0.0–40.0)

## 2021-08-16 LAB — LDL CHOLESTEROL, DIRECT: Direct LDL: 152 mg/dL

## 2021-08-16 LAB — HEMOGLOBIN A1C: Hgb A1c MFr Bld: 5.7 % (ref 4.6–6.5)

## 2021-08-16 NOTE — Progress Notes (Signed)
No critical labs need to be addressed urgently. We will discuss labs in detail at upcoming office visit.   

## 2021-08-23 ENCOUNTER — Encounter: Payer: 59 | Admitting: Family Medicine

## 2021-08-26 ENCOUNTER — Encounter: Payer: 59 | Admitting: Family Medicine

## 2021-09-23 ENCOUNTER — Other Ambulatory Visit: Payer: Self-pay | Admitting: Family Medicine

## 2021-09-23 NOTE — Telephone Encounter (Signed)
Last office visit 07/06/20 for CPE.  Last refilled 04/10/21 for #135 with 1 refill.  Patient has cancelled her last 2 CPE appointments.  [place] office has left her a message today to call back and schedule CPE.  No future appointments.  Refill?

## 2021-09-23 NOTE — Telephone Encounter (Signed)
LVM for patient to call and schedule

## 2021-09-23 NOTE — Telephone Encounter (Signed)
Please call and schedule CPE with fasting labs prior for Dr. Diona Browner.  Then send back to me to refill her medication to get her that appt.

## 2021-10-03 ENCOUNTER — Telehealth: Payer: Self-pay | Admitting: Family Medicine

## 2021-10-03 NOTE — Telephone Encounter (Signed)
Caller Name: Saraih booth-gobble  Call back phone #: [phone]  MEDICATION(S):  sertraline (ZOLOFT) 50 MG tablet  Days of Med Remaining: week & half   Has the patient contacted their pharmacy (YES/NO)? YES  What did pharmacy advise?  Pharamacy told pt to contact pcp for additonal refill.   Preferred Pharmacy:  [facility], [address], [place].   ~~~Please advise patient/caregiver to allow 2-3 business days to process RX refills.

## 2021-10-03 NOTE — Telephone Encounter (Signed)
Patient has not been seen in over a year.  Please call back and schedule CPE with fasting labs prior with Dr. Diona Browner.  Then send back to me and I will refill to get her to that appointment.

## 2021-10-04 NOTE — Telephone Encounter (Signed)
Called pt & scheduled cpe for 11/16/21

## 2021-10-08 ENCOUNTER — Encounter: Payer: Self-pay | Admitting: Family Medicine

## 2021-10-08 ENCOUNTER — Other Ambulatory Visit: Payer: Self-pay | Admitting: Family Medicine

## 2021-11-08 ENCOUNTER — Telehealth: Payer: Self-pay | Admitting: Family Medicine

## 2021-11-08 ENCOUNTER — Other Ambulatory Visit (INDEPENDENT_AMBULATORY_CARE_PROVIDER_SITE_OTHER): Payer: 59

## 2021-11-08 DIAGNOSIS — R7303 Prediabetes: Secondary | ICD-10-CM | POA: Diagnosis not present

## 2021-11-08 LAB — COMPREHENSIVE METABOLIC PANEL
ALT: 25 U/L (ref 0–35)
AST: 18 U/L (ref 0–37)
Albumin: 4.5 g/dL (ref 3.5–5.2)
Alkaline Phosphatase: 95 U/L (ref 39–117)
BUN: 10 mg/dL (ref 6–23)
CO2: 28 mEq/L (ref 19–32)
Calcium: 9 mg/dL (ref 8.4–10.5)
Chloride: 101 mEq/L (ref 96–112)
Creatinine, Ser: 0.8 mg/dL (ref 0.40–1.20)
GFR: 89.97 mL/min (ref >60.00)
Glucose, Bld: 103 mg/dL — ABNORMAL HIGH (ref 70–99)
Potassium: 4.1 mEq/L (ref 3.5–5.1)
Sodium: 136 mEq/L (ref 135–145)
Total Bilirubin: 0.3 mg/dL (ref 0.2–1.2)
Total Protein: 7 g/dL (ref 6.0–8.3)

## 2021-11-08 LAB — LIPID PANEL
Cholesterol: 199 mg/dL (ref 0–200)
HDL: 52.6 mg/dL (ref >39.00)
LDL Cholesterol: 117 mg/dL — ABNORMAL HIGH (ref 0–99)
NonHDL: 146.19
Total CHOL/HDL Ratio: 4
Triglycerides: 147 mg/dL (ref 0.0–149.0)
VLDL: 29.4 mg/dL (ref 0.0–40.0)

## 2021-11-08 LAB — HEMOGLOBIN A1C: Hgb A1c MFr Bld: 6 % (ref 4.6–6.5)

## 2021-11-08 NOTE — Telephone Encounter (Signed)
-----   Message from Ellamae Sia sent at 10/31/2021 12:45 PM EDT ----- Regarding: Lab orders for Tuesday, 10.17.23 Patient is scheduled for CPX labs, please order future labs, Thanks , Karna Christmas

## 2021-11-08 NOTE — Progress Notes (Signed)
No critical labs need to be addressed urgently. We will discuss labs in detail at upcoming office visit.   

## 2021-11-16 ENCOUNTER — Ambulatory Visit (INDEPENDENT_AMBULATORY_CARE_PROVIDER_SITE_OTHER): Payer: 59 | Admitting: Family Medicine

## 2021-11-16 VITALS — BP 138/80 | HR 78 | Temp 97.1°F | Ht 66.5 in | Wt 226.5 lb

## 2021-11-16 DIAGNOSIS — R7303 Prediabetes: Secondary | ICD-10-CM

## 2021-11-16 DIAGNOSIS — F411 Generalized anxiety disorder: Secondary | ICD-10-CM

## 2021-11-16 DIAGNOSIS — Z0000 Encounter for general adult medical examination without abnormal findings: Secondary | ICD-10-CM | POA: Diagnosis not present

## 2021-11-16 DIAGNOSIS — E669 Obesity, unspecified: Secondary | ICD-10-CM

## 2021-11-16 NOTE — Assessment & Plan Note (Signed)
Chronic, slightly worsened control in the last 3 months but she has been less active.  She plans to restart with healthy lifestyle including low-carb diet and increased exercise.  Evaluate in 1 year

## 2021-11-16 NOTE — Assessment & Plan Note (Signed)
Stable, chronic.  Continue current medication.   Sertraline 75 mg daily

## 2021-11-16 NOTE — Progress Notes (Signed)
Patient ID: Teresa Brock, female    DOB: 1976/01/02, 46 y.o.   MRN: [id number]  This visit was conducted in person.  BP 138/80   Pulse 78   Temp (!) 97.1 F (36.2 C) (Temporal)   Ht 5' 6.5" (1.689 m)   Wt 226 lb 8 oz (102.7 kg)   LMP 10/31/2021 (Exact Date)   SpO2 97%   BMI 36.01 kg/m    CC:  Chief Complaint  Patient presents with   Annual Exam    No concerns    Subjective:   HPI: Teresa Brock is a 46 y.o. female presenting on 11/16/2021 for Annual Exam (No concerns)  Doing well overall. No new concerns.  Recent COVID 10/10 still with some residual cough.. given benzonatate and prednisone at recent virtual OV.  Diet:  heart healthy diet Exercise:minimal Wt Readings from Last 3 Encounters:  11/16/21 226 lb 8 oz (102.7 kg)  07/06/20 224 lb 8 oz (101.8 kg)  03/05/20 226 lb 4 oz (102.6 kg)   Body mass index is 36.01 kg/m.   GAD: Stable control on sertraline 75 mg daily. GAD 7 , PHQ9 4 (has been slightly worse with recent COVID)  Reviewed labs in detail with patient.  Prediabetes  Lab Results  Component Value Date   HGBA1C 6.0 11/08/2021   Lab Results  Component Value Date   CHOL 199 11/08/2021   HDL 52.60 11/08/2021   LDLCALC 117 (H) 11/08/2021   LDLDIRECT 152.0 08/16/2021   TRIG 147.0 11/08/2021   CHOLHDL 4 11/08/2021   The 10-year ASCVD risk score (Arnett DK, et al., 2019) is: 0.9%   Values used to calculate the score:     Age: 41 years     Sex: Female     Is Non-Hispanic African American: No     Diabetic: No     Tobacco smoker: No     Systolic Blood Pressure: 947 mmHg     Is BP treated: No     HDL Cholesterol: 52.6 mg/dL     Total Cholesterol: 199 mg/dL       Relevant past medical, surgical, family and social history reviewed and updated as indicated. Interim medical history since our last visit reviewed. Allergies and medications reviewed and updated. Outpatient Medications Prior to Visit  Medication Sig Dispense  Refill   b complex vitamins tablet Take 1 tablet by mouth daily.     benzonatate (TESSALON) 200 MG capsule Take 400 mg by mouth every 8 (eight) hours as needed.     famotidine (PEPCID AC) 10 MG chewable tablet Chew 10 mg by mouth as needed for heartburn.     fluticasone (FLONASE) 50 MCG/ACT nasal spray Place into the nose.     predniSONE (DELTASONE) 20 MG tablet Take 20 mg by mouth daily.     sertraline (ZOLOFT) 50 MG tablet TAKE ONE AND ONE-HALF TABLETS BY MOUTH AT BEDTIME 135 tablet 0   vitamin E 400 UNIT capsule Take 400 Units by mouth daily.     fexofenadine (ALLEGRA) 60 MG tablet Take 60 mg by mouth daily as needed for allergies or rhinitis.     Multiple Vitamin (MULTIVITAMIN) tablet Take 1 tablet by mouth daily.     No facility-administered medications prior to visit.     Per HPI unless specifically indicated in ROS section below Review of Systems  Constitutional:  Negative for fatigue and fever.  HENT:  Negative for congestion.   Eyes:  Negative for pain.  Respiratory:  Negative for cough and shortness of breath.   Cardiovascular:  Negative for chest pain, palpitations and leg swelling.  Gastrointestinal:  Negative for abdominal pain.  Genitourinary:  Negative for dysuria and vaginal bleeding.  Musculoskeletal:  Negative for back pain.  Neurological:  Negative for syncope, light-headedness and headaches.  Psychiatric/Behavioral:  Negative for dysphoric mood.    Objective:  BP 138/80   Pulse 78   Temp (!) 97.1 F (36.2 C) (Temporal)   Ht 5' 6.5" (1.689 m)   Wt 226 lb 8 oz (102.7 kg)   LMP 10/31/2021 (Exact Date)   SpO2 97%   BMI 36.01 kg/m   Wt Readings from Last 3 Encounters:  11/16/21 226 lb 8 oz (102.7 kg)  07/06/20 224 lb 8 oz (101.8 kg)  03/05/20 226 lb 4 oz (102.6 kg)      Physical Exam Vitals and nursing note reviewed.  Constitutional:      General: She is not in acute distress.    Appearance: Normal appearance. She is well-developed. She is not  ill-appearing or toxic-appearing.  HENT:     Head: Normocephalic.     Right Ear: Hearing, tympanic membrane, ear canal and external ear normal.     Left Ear: Hearing, tympanic membrane, ear canal and external ear normal.     Nose: Nose normal.  Eyes:     General: Lids are normal. Lids are everted, no foreign bodies appreciated.     Conjunctiva/sclera: Conjunctivae normal.     Pupils: Pupils are equal, round, and reactive to light.  Neck:     Thyroid: No thyroid mass or thyromegaly.     Vascular: No carotid bruit.     Trachea: Trachea normal.  Cardiovascular:     Rate and Rhythm: Normal rate and regular rhythm.     Heart sounds: Normal heart sounds, S1 normal and S2 normal. No murmur heard.    No gallop.  Pulmonary:     Effort: Pulmonary effort is normal. No respiratory distress.     Breath sounds: Normal breath sounds. No wheezing, rhonchi or rales.  Abdominal:     General: Bowel sounds are normal. There is no distension or abdominal bruit.     Palpations: Abdomen is soft. There is no fluid wave or mass.     Tenderness: There is no abdominal tenderness. There is no guarding or rebound.     Hernia: No hernia is present.  Musculoskeletal:     Cervical back: Normal range of motion and neck supple.  Lymphadenopathy:     Cervical: No cervical adenopathy.  Skin:    General: Skin is warm and dry.     Findings: No rash.  Neurological:     Mental Status: She is alert.     Cranial Nerves: No cranial nerve deficit.     Sensory: No sensory deficit.  Psychiatric:        Mood and Affect: Mood is not anxious or depressed.        Speech: Speech normal.        Behavior: Behavior normal. Behavior is cooperative.        Judgment: Judgment normal.       Results for orders placed or performed in visit on 11/08/21  Hemoglobin A1c  Result Value Ref Range   Hgb A1c MFr Bld 6.0 4.6 - 6.5 %  Lipid panel  Result Value Ref Range   Cholesterol 199 0 - 200 mg/dL   Triglycerides 147.0 0.0 -  149.0 mg/dL   HDL  52.60 >39.00 mg/dL   VLDL 29.4 0.0 - 40.0 mg/dL   LDL Cholesterol 117 (H) 0 - 99 mg/dL   Total CHOL/HDL Ratio 4    NonHDL 146.19   Comprehensive metabolic panel  Result Value Ref Range   Sodium 136 135 - 145 mEq/L   Potassium 4.1 3.5 - 5.1 mEq/L   Chloride 101 96 - 112 mEq/L   CO2 28 19 - 32 mEq/L   Glucose, Bld 103 (H) 70 - 99 mg/dL   BUN 10 6 - 23 mg/dL   Creatinine, Ser 0.80 0.40 - 1.20 mg/dL   Total Bilirubin 0.3 0.2 - 1.2 mg/dL   Alkaline Phosphatase 95 39 - 117 U/L   AST 18 0 - 37 U/L   ALT 25 0 - 35 U/L   Total Protein 7.0 6.0 - 8.3 g/dL   Albumin 4.5 3.5 - 5.2 g/dL   GFR 89.97 >60.00 mL/min   Calcium 9.0 8.4 - 10.5 mg/dL     COVID 19 screen:  No recent travel or known exposure to COVID19 The patient denies respiratory symptoms of COVID 19 at this time. The importance of social distancing was discussed today.   Assessment and Plan The patient's preventative maintenance and recommended screening tests for an annual wellness exam were reviewed in full today. Brought up to date unless services declined.  Counselled on the importance of diet, exercise, and its role in overall health and mortality. The patient's FH and SH was reviewed, including their home life, tobacco status, and drug and alcohol status.   Vacccines up to date.  COVID x 3 refuses flu shot Former smoker, remote Pelvic: sees GYN,  OBGYN: followed. Mammogram : breast bx 2019.. nonmalignant,  02/2021  nml mammo STD testing: refused. No early colon cancer in family.  Hep C negative   Problem List Items Addressed This Visit     GAD (generalized anxiety disorder)    Stable, chronic.  Continue current medication.   Sertraline 75 mg daily      Obesity, Class II, BMI 35-39.9, no comorbidity    Encouraged exercise, weight loss, healthy eating habits.       Prediabetes    Chronic, slightly worsened control in the last 3 months but she has been less active.  She plans to  restart with healthy lifestyle including low-carb diet and increased exercise.  Evaluate in 1 year      Other Visit Diagnoses     Routine general medical examination at a health care facility    -  Primary       Eliezer Lofts, MD

## 2021-11-16 NOTE — Assessment & Plan Note (Signed)
Encouraged exercise, weight loss, healthy eating habits. ? ?

## 2022-01-17 ENCOUNTER — Other Ambulatory Visit: Payer: Self-pay | Admitting: Family Medicine

## 2022-02-20 ENCOUNTER — Other Ambulatory Visit: Payer: Self-pay | Admitting: Family Medicine

## 2022-02-20 DIAGNOSIS — Z1231 Encounter for screening mammogram for malignant neoplasm of breast: Secondary | ICD-10-CM

## 2022-03-01 ENCOUNTER — Ambulatory Visit (INDEPENDENT_AMBULATORY_CARE_PROVIDER_SITE_OTHER): Payer: No Typology Code available for payment source

## 2022-03-01 DIAGNOSIS — Z1231 Encounter for screening mammogram for malignant neoplasm of breast: Secondary | ICD-10-CM | POA: Diagnosis not present

## 2022-03-15 DIAGNOSIS — Z1231 Encounter for screening mammogram for malignant neoplasm of breast: Secondary | ICD-10-CM

## 2022-04-07 ENCOUNTER — Ambulatory Visit: Payer: No Typology Code available for payment source | Admitting: Family Medicine

## 2022-04-14 ENCOUNTER — Ambulatory Visit: Payer: No Typology Code available for payment source | Admitting: Family Medicine

## 2022-04-28 ENCOUNTER — Ambulatory Visit: Payer: No Typology Code available for payment source | Admitting: Family Medicine

## 2022-04-28 ENCOUNTER — Other Ambulatory Visit (HOSPITAL_COMMUNITY)
Admission: RE | Admit: 2022-04-28 | Discharge: 2022-04-28 | Disposition: A | Discharge status: Home / Self Care | Payer: No Typology Code available for payment source | Source: Ambulatory Visit | Attending: Family Medicine | Admitting: Family Medicine

## 2022-04-28 VITALS — BP 130/88 | HR 78 | Temp 98.1°F | Ht 66.5 in | Wt 231.2 lb

## 2022-04-28 DIAGNOSIS — Z124 Encounter for screening for malignant neoplasm of cervix: Secondary | ICD-10-CM | POA: Diagnosis present

## 2022-04-28 NOTE — Progress Notes (Signed)
Patient ID: Teresa Brock, female    DOB: 08/07/1975, 47 y.o.   MRN: [id number]  This visit was conducted in person.  BP 130/88   Pulse 78   Temp 98.1 F (36.7 C) (Temporal)   Ht 5' 6.5" (1.689 m)   Wt 231 lb 4 oz (104.9 kg)   LMP 04/17/2022   SpO2 97%   BMI 36.77 kg/m    CC:  Chief Complaint  Patient presents with   Gynecologic Exam    Pap Smear    Subjective:   HPI: Teresa Brock is a 47 y.o. female presenting on 04/28/2022 for Gynecologic Exam (Pap Smear)  Recent complete physical exam, unable to do at that time.  She presents today  for Pap Smear.   2019 nml pap.  She  has not been having any pelvic pain, discharge or vaginal itching.      Relevant past medical, surgical, family and social history reviewed and updated as indicated. Interim medical history since our last visit reviewed. Allergies and medications reviewed and updated. Outpatient Medications Prior to Visit  Medication Sig Dispense Refill   famotidine (PEPCID AC) 10 MG chewable tablet Chew 10 mg by mouth as needed for heartburn.     fluticasone (FLONASE) 50 MCG/ACT nasal spray Place into the nose.     OVER THE COUNTER MEDICATION AG1 powder drink supplement every morning     sertraline (ZOLOFT) 50 MG tablet TAKE ONE AND ONE-HALF TABLETS BY MOUTH AT BEDTIME 135 tablet 1   b complex vitamins tablet Take 1 tablet by mouth daily.     benzonatate (TESSALON) 200 MG capsule Take 400 mg by mouth every 8 (eight) hours as needed.     predniSONE (DELTASONE) 20 MG tablet Take 20 mg by mouth daily.     vitamin E 400 UNIT capsule Take 400 Units by mouth daily.     No facility-administered medications prior to visit.     Per HPI unless specifically indicated in ROS section below Review of Systems  Constitutional:  Negative for fatigue and fever.  HENT:  Negative for congestion.   Eyes:  Negative for pain.  Respiratory:  Negative for cough and shortness of breath.   Cardiovascular:   Negative for chest pain, palpitations and leg swelling.  Gastrointestinal:  Negative for abdominal pain.  Genitourinary:  Negative for dysuria and vaginal bleeding.  Musculoskeletal:  Negative for back pain.  Neurological:  Negative for syncope, light-headedness and headaches.  Psychiatric/Behavioral:  Negative for dysphoric mood.    Objective:  BP 130/88   Pulse 78   Temp 98.1 F (36.7 C) (Temporal)   Ht 5' 6.5" (1.689 m)   Wt 231 lb 4 oz (104.9 kg)   LMP 04/17/2022   SpO2 97%   BMI 36.77 kg/m   Wt Readings from Last 3 Encounters:  04/28/22 231 lb 4 oz (104.9 kg)  11/16/21 226 lb 8 oz (102.7 kg)  07/06/20 224 lb 8 oz (101.8 kg)      Physical Exam Exam conducted with a chaperone present.  Abdominal:     Hernia: There is no hernia in the left inguinal area or right inguinal area.  Genitourinary:    Exam position: Lithotomy position.     Pubic Area: No rash.      Labia:        Right: No rash.        Left: No rash.      Urethra: No prolapse, urethral pain or urethral swelling.  Vagina: Normal.     Cervix: Normal.     Uterus: Normal.      Adnexa: Right adnexa normal and left adnexa normal.     Rectum: Normal.  Lymphadenopathy:     Lower Body: No right inguinal adenopathy. No left inguinal adenopathy.        Assessment and Plan  Screening for cervical cancer Assessment & Plan: No patient concerns.  Routine cervical cancer screening performed with Pap smear and high-risk HPV evaluation.  Addendum: Results for orders placed or performed in visit on 04/28/22  Cytology - PAP([facility])  Result Value Ref Range   High risk HPV Negative    Adequacy      Satisfactory for evaluation; transformation zone component PRESENT.   Diagnosis      - Negative for intraepithelial lesion or malignancy (NILM)   Comment Normal Reference Range HPV - Negative     Orders: -     Cytology - PAP    No follow-ups on file.   Kerby NoraAmy Mikiah Durall, MD

## 2022-05-04 LAB — CYTOLOGY - PAP
Comment: NEGATIVE
Diagnosis: NEGATIVE
High risk HPV: NEGATIVE

## 2022-05-09 DIAGNOSIS — Z124 Encounter for screening for malignant neoplasm of cervix: Secondary | ICD-10-CM | POA: Insufficient documentation

## 2022-05-09 NOTE — Assessment & Plan Note (Signed)
No patient concerns.  Routine cervical cancer screening performed with Pap smear and high-risk HPV evaluation.  Addendum: Results for orders placed or performed in visit on 04/28/22  Cytology - PAP([facility])  Result Value Ref Range   High risk HPV Negative    Adequacy      Satisfactory for evaluation; transformation zone component PRESENT.   Diagnosis      - Negative for intraepithelial lesion or malignancy (NILM)   Comment Normal Reference Range HPV - Negative

## 2022-07-20 ENCOUNTER — Other Ambulatory Visit: Payer: Self-pay | Admitting: Family Medicine

## 2022-09-07 ENCOUNTER — Encounter (INDEPENDENT_AMBULATORY_CARE_PROVIDER_SITE_OTHER): Payer: Self-pay

## 2022-09-11 ENCOUNTER — Telehealth: Payer: Self-pay | Admitting: *Deleted

## 2022-09-11 DIAGNOSIS — R7303 Prediabetes: Secondary | ICD-10-CM

## 2022-09-11 DIAGNOSIS — Z13220 Encounter for screening for lipoid disorders: Secondary | ICD-10-CM

## 2022-09-11 NOTE — Telephone Encounter (Signed)
-----   Message from Alvina Chou sent at 09/11/2022 11:29 AM EDT ----- Regarding: Lab orders for Wednesday, 8.28.24 Patient is scheduled for CPX labs, please order future labs, Thanks , Camelia Eng

## 2022-09-20 ENCOUNTER — Other Ambulatory Visit: Payer: No Typology Code available for payment source

## 2022-09-27 ENCOUNTER — Encounter: Payer: No Typology Code available for payment source | Admitting: Family Medicine

## 2023-02-19 ENCOUNTER — Other Ambulatory Visit: Payer: Self-pay | Admitting: Physician Assistant

## 2023-02-19 DIAGNOSIS — Z1231 Encounter for screening mammogram for malignant neoplasm of breast: Secondary | ICD-10-CM

## 2023-03-07 ENCOUNTER — Ambulatory Visit: Payer: No Typology Code available for payment source

## 2023-03-07 DIAGNOSIS — Z1231 Encounter for screening mammogram for malignant neoplasm of breast: Secondary | ICD-10-CM | POA: Diagnosis not present

## 2023-05-15 IMAGING — MG MM DIGITAL SCREENING BILAT W/ TOMO AND CAD
8 series · 8 of 24 positions shown · non-contrast
Comparison: Previous exam(s).

CLINICAL DATA: Screening.

EXAM:
DIGITAL SCREENING BILATERAL MAMMOGRAM WITH TOMOSYNTHESIS AND CAD
TECHNIQUE: Bilateral screening digital craniocaudal and mediolateral oblique
mammograms were obtained. Bilateral screening digital breast
tomosynthesis was performed. The images were evaluated with
computer-aided detection.

[R MLO synth-2D]
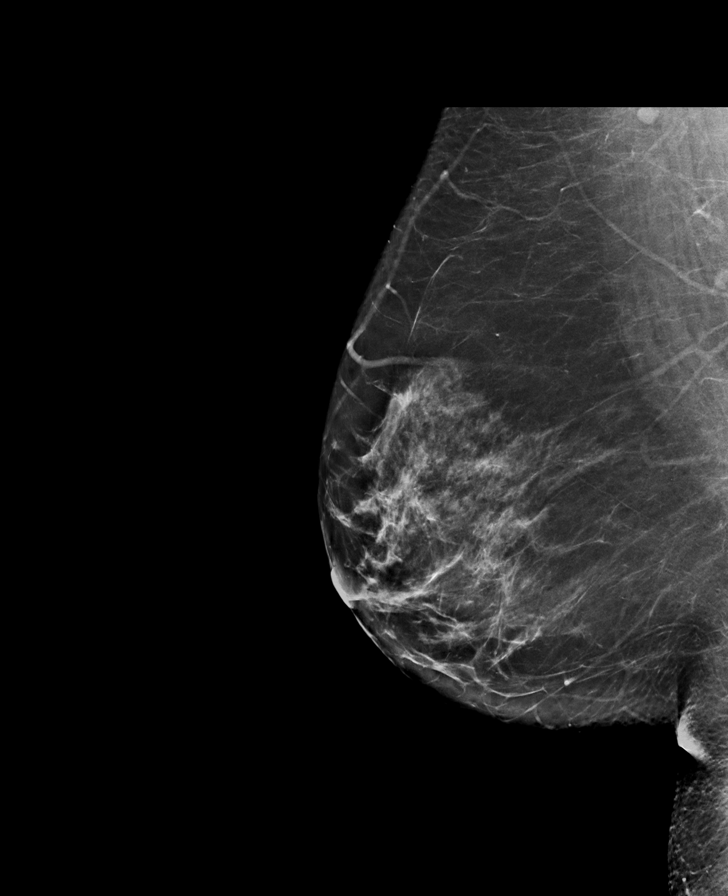

[L MLO synth-2D]
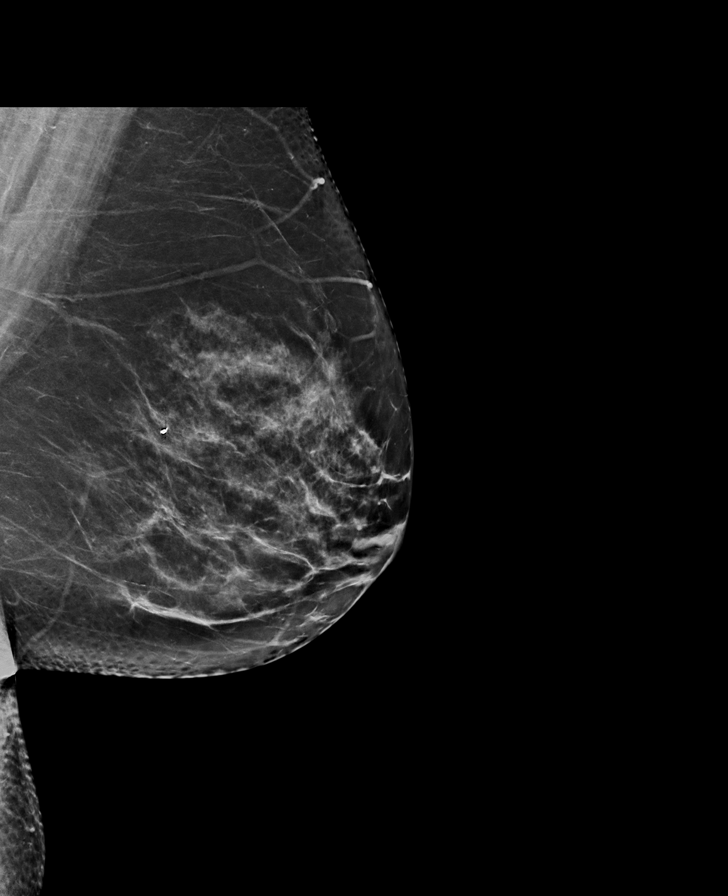

[L CC synth-2D]
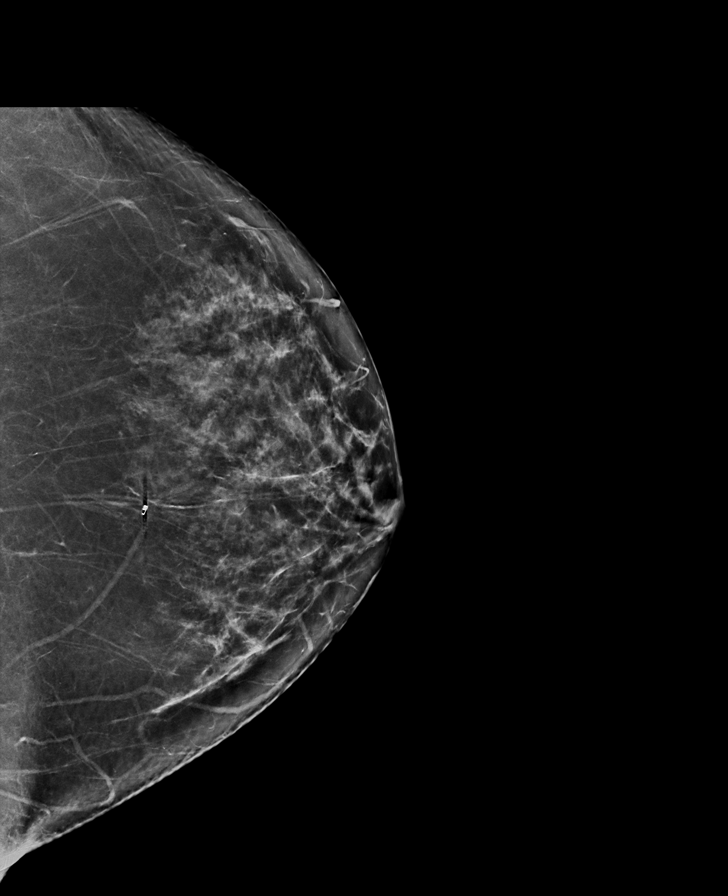

[R CC synth-2D]
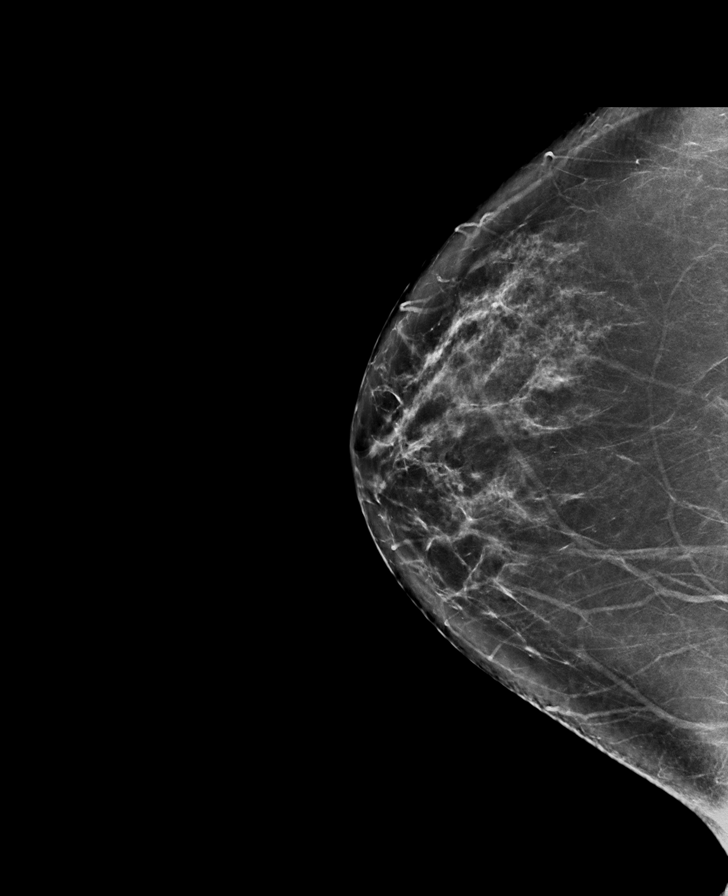

[L CC tomo · tomo slice 40/79.0]
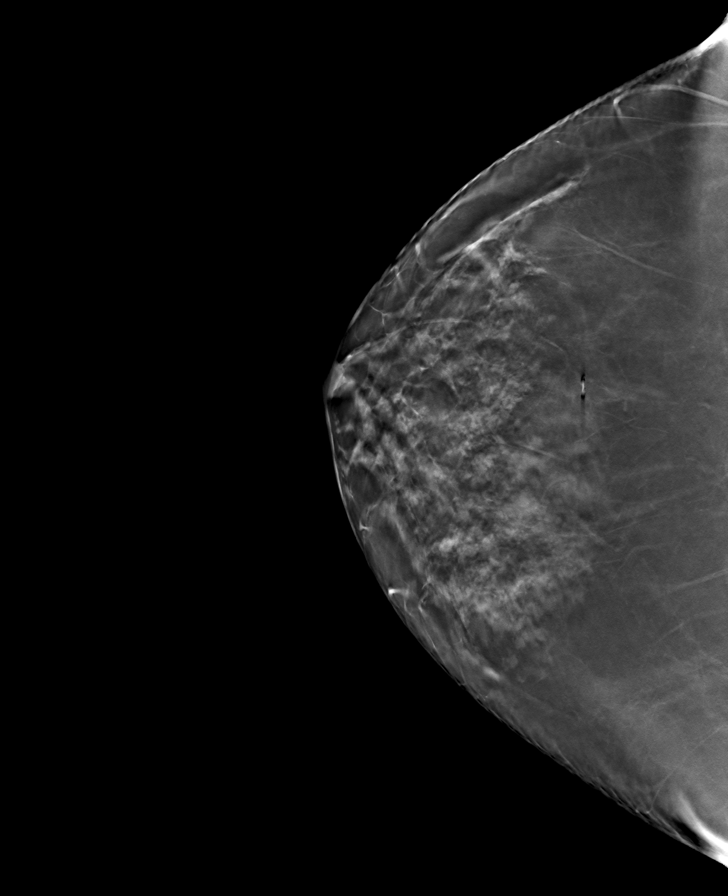

[R CC tomo · tomo slice 43/84.0]
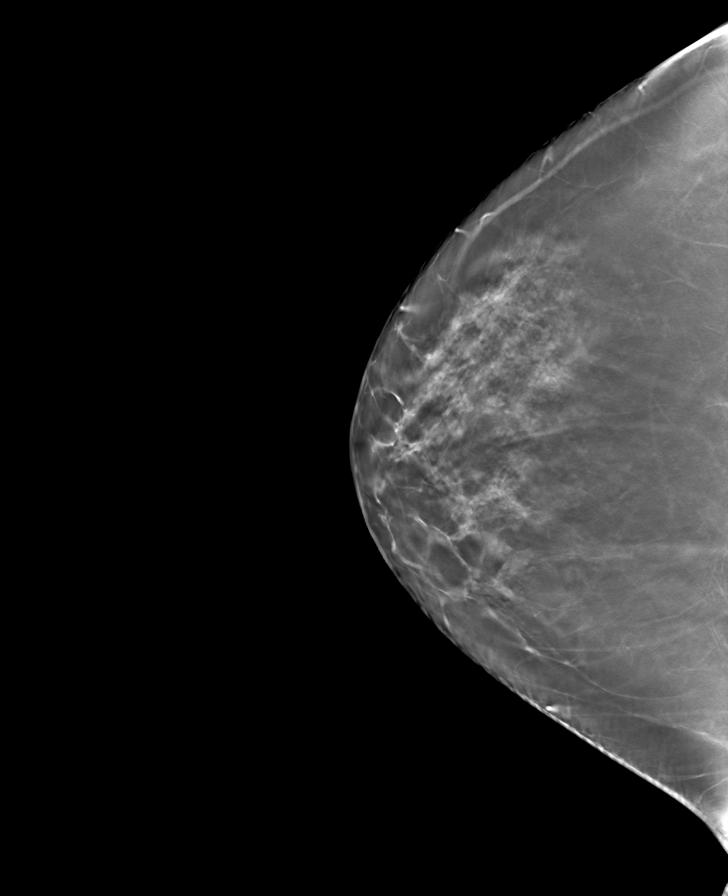

[R MLO tomo · tomo slice 45/88.0]
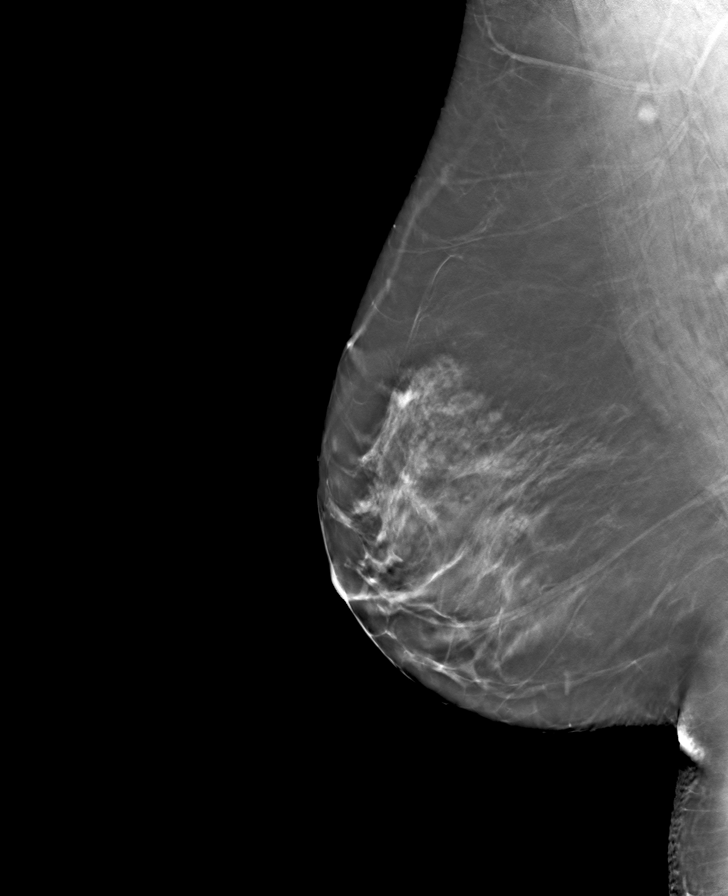

[L MLO tomo · tomo slice 41/81.0]
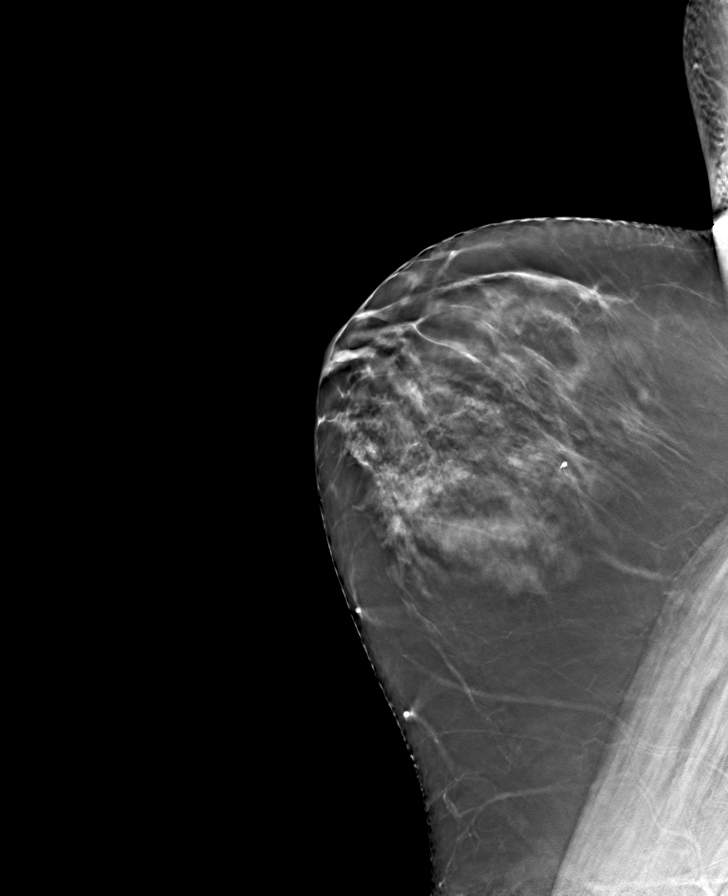

[8 of 24 positions shown; findings below may reference images not displayed]

ACR Breast Density Category c: The breast tissue is heterogeneously
dense, which may obscure small masses.
FINDINGS: There are no findings suspicious for malignancy.
IMPRESSION: No mammographic evidence of malignancy. A result letter of this
screening mammogram will be mailed directly to the patient.

RECOMMENDATION:
Screening mammogram in one year. (Code:Q3-W-BC3)

BI-RADS CATEGORY  1: Negative.
